# Patient Record
Sex: Female | Born: 2000 | Race: Black or African American | Hispanic: No | Marital: Single | Smoking: Never smoker
Health system: Southern US, Community
[De-identification: ages and names within clinical notes are randomized; demographics above are authoritative.]

## PROBLEM LIST (undated history)

## (undated) DIAGNOSIS — R6250 Unspecified lack of expected normal physiological development in childhood: Secondary | ICD-10-CM

## (undated) DIAGNOSIS — F319 Bipolar disorder, unspecified: Secondary | ICD-10-CM

## (undated) DIAGNOSIS — F419 Anxiety disorder, unspecified: Secondary | ICD-10-CM

## (undated) DIAGNOSIS — F913 Oppositional defiant disorder: Secondary | ICD-10-CM

## (undated) DIAGNOSIS — J45909 Unspecified asthma, uncomplicated: Secondary | ICD-10-CM

## (undated) DIAGNOSIS — F909 Attention-deficit hyperactivity disorder, unspecified type: Secondary | ICD-10-CM

## (undated) DIAGNOSIS — T7840XA Allergy, unspecified, initial encounter: Secondary | ICD-10-CM

## (undated) HISTORY — PX: NO PAST SURGERIES: SHX2092

## (undated) HISTORY — DX: Anxiety disorder, unspecified: F41.9

---
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
## 2022-01-04 ENCOUNTER — Ambulatory Visit (INDEPENDENT_AMBULATORY_CARE_PROVIDER_SITE_OTHER): Payer: Medicaid Other | Admitting: Advanced Practice Midwife

## 2022-01-04 ENCOUNTER — Encounter: Payer: Self-pay | Admitting: Advanced Practice Midwife

## 2022-01-04 VITALS — BP 120/78 | HR 101 | Wt 150.0 lb

## 2022-01-04 DIAGNOSIS — Z3403 Encounter for supervision of normal first pregnancy, third trimester: Secondary | ICD-10-CM

## 2022-01-04 DIAGNOSIS — Z3A34 34 weeks gestation of pregnancy: Secondary | ICD-10-CM

## 2022-01-04 NOTE — Progress Notes (Signed)
   LOW-RISK PREGNANCY VISIT Patient name: Alexandria [name] MRN [id number]  Date of birth: 11/12/2000 Chief Complaint:   Routine Prenatal Visit  History of Present Illness:   Alexandria [name] is a 21 y.o. G1P0 female at [redacted]w[redacted]d with an Estimated Date of Delivery: 02/12/22 being seen today for ongoing management of a low-risk pregnancy.  Today she reports no complaints. Contractions: Not present. Vag. Bleeding: None.  Movement: Present. denies leaking of fluid. Review of Systems:   Pertinent items are noted in HPI Denies abnormal vaginal discharge w/ itching/odor/irritation, headaches, visual changes, shortness of breath, chest pain, abdominal pain, severe nausea/vomiting, or problems with urination or bowel movements unless otherwise stated above. Pertinent History Reviewed:  Reviewed past medical,surgical, social, obstetrical and family history.  Reviewed problem list, medications and allergies. Physical Assessment:   Vitals:   01/04/22 1610  BP: 120/78  Pulse: (!) 101  Weight: 150 lb (68 kg)  Body mass index is 26.57 kg/m.        Physical Examination:   General appearance: Well appearing, and in no distress  Mental status: Alert, oriented to person, place, and time  Skin: Warm & dry  Cardiovascular: Normal heart rate noted  Respiratory: Normal respiratory effort, no distress  Abdomen: Soft, gravid, nontender  Pelvic: Cervical exam deferred         Extremities: Edema: None  Fetal Status: Fetal Heart Rate (bpm): 138 Fundal Height: 33 cm Movement: Present    Chaperone: n/a    No results found for this or any previous visit (from the past 24 hour(s)).  Assessment & Plan:  1) Low-risk pregnancy G1P0 at [redacted]w[redacted]d with an Estimated Date of Delivery: 02/12/22     Meds: No orders of the defined types were placed in this encounter.  Labs/procedures today:   Plan:  Continue routine obstetrical care  Next visit: prefers in person    Reviewed: Preterm labor symptoms and general  obstetric precautions including but not limited to vaginal bleeding, contractions, leaking of fluid and fetal movement were reviewed in detail with the patient.  All questions were answered. Has home bp cuff.. Check bp weekly, let us know if >140/90.   Follow-up: No follow-ups on file.  Future Appointments  Date Time Provider Department Center  01/18/2022  4:10 PM Arabella Merles, CNM [facility]  01/24/2022  3:00 PM [facility] - [facility] [place] [facility] None  01/25/2022  4:10 PM Arabella Merles, CNM [facility]  02/01/2022  4:10 PM Arabella Merles, CNM [facility]  02/08/2022  4:10 PM Arabella Merles, CNM [facility]    No orders of the defined types were placed in this encounter.  Jacklyn Shell DNP, CNM 01/04/2022 4:22 PM

## 2022-01-05 ENCOUNTER — Encounter: Payer: Medicaid Other | Admitting: Advanced Practice Midwife

## 2022-01-11 ENCOUNTER — Encounter: Payer: Medicaid Other | Admitting: Advanced Practice Midwife

## 2022-01-18 ENCOUNTER — Encounter: Payer: Medicaid Other | Admitting: Advanced Practice Midwife

## 2022-01-23 ENCOUNTER — Other Ambulatory Visit: Payer: Self-pay | Admitting: Women's Health

## 2022-01-23 ENCOUNTER — Ambulatory Visit (INDEPENDENT_AMBULATORY_CARE_PROVIDER_SITE_OTHER): Payer: Medicaid Other | Admitting: Women's Health

## 2022-01-23 ENCOUNTER — Encounter: Payer: Self-pay | Admitting: Women's Health

## 2022-01-23 ENCOUNTER — Other Ambulatory Visit (HOSPITAL_COMMUNITY)
Admission: RE | Admit: 2022-01-23 | Discharge: 2022-01-23 | Disposition: A | Discharge status: Home / Self Care | Payer: Medicaid Other | Source: Ambulatory Visit | Attending: Advanced Practice Midwife | Admitting: Advanced Practice Midwife

## 2022-01-23 VITALS — BP 96/60 | HR 77 | Wt 156.0 lb

## 2022-01-23 DIAGNOSIS — O26843 Uterine size-date discrepancy, third trimester: Secondary | ICD-10-CM

## 2022-01-23 DIAGNOSIS — Z3403 Encounter for supervision of normal first pregnancy, third trimester: Secondary | ICD-10-CM

## 2022-01-23 DIAGNOSIS — Z3A37 37 weeks gestation of pregnancy: Secondary | ICD-10-CM | POA: Insufficient documentation

## 2022-01-23 NOTE — Progress Notes (Signed)
LOW-RISK PREGNANCY VISIT Patient name: Alexandria [name] MRN [id number]  Date of birth: 03/11/2001 Chief Complaint:   Routine Prenatal Visit  History of Present Illness:   Alexandria [name] is a 21 y.o. G1P0 female at [redacted]w[redacted]d with an Estimated Date of Delivery: 02/12/22 being seen today for ongoing management of a low-risk pregnancy.   Today she reports no complaints. Contractions: Not present. Vag. Bleeding: None.  Movement: Present. denies leaking of fluid.     11/09/2021    9:53 AM 08/10/2021   10:53 AM  Depression screen PHQ 2/9  Decreased Interest 0 0  Down, Depressed, Hopeless 0 1  PHQ - 2 Score 0 1  Altered sleeping 0 0  Tired, decreased energy 1 1  Change in appetite 1 0  Feeling bad or failure about yourself  0 0  Trouble concentrating 0 0  Moving slowly or fidgety/restless 0 0  Suicidal thoughts 0 0  PHQ-9 Score 2 2        11/09/2021    9:53 AM 08/10/2021   10:53 AM  GAD 7 : Generalized Anxiety Score  Nervous, Anxious, on Edge 0 0  Control/stop worrying 1 0  Worry too much - different things 1 0  Trouble relaxing 0 0  Restless 0 0  Easily annoyed or irritable 1 0  Afraid - awful might happen 0 0  Total GAD 7 Score 3 0      Review of Systems:   Pertinent items are noted in HPI Denies abnormal vaginal discharge w/ itching/odor/irritation, headaches, visual changes, shortness of breath, chest pain, abdominal pain, severe nausea/vomiting, or problems with urination or bowel movements unless otherwise stated above. Pertinent History Reviewed:  Reviewed past medical,surgical, social, obstetrical and family history.  Reviewed problem list, medications and allergies. Physical Assessment:   Vitals:   01/23/22 1559  BP: 96/60  Pulse: 77  Weight: 156 lb (70.8 kg)  Body mass index is 27.63 kg/m.        Physical Examination:   General appearance: Well appearing, and in no distress  Mental status: Alert, oriented to person, place, and time  Skin: Warm &  dry  Cardiovascular: Normal heart rate noted  Respiratory: Normal respiratory effort, no distress  Abdomen: Soft, gravid, nontender  Pelvic:  cultures obtained, SVE deferred          Extremities: Edema: None  Fetal Status: Fetal Heart Rate (bpm): 140 Fundal Height: 30 cm Movement: Present Presentation: Vertex  Chaperone: Latisha Cresenzo   No results found for this or any previous visit (from the past 24 hour(s)).  Assessment & Plan:  1) Low-risk pregnancy G1P0 at [redacted]w[redacted]d with an Estimated Date of Delivery: 02/12/22   2) Size <dates, has EFW u/s tomorrow   Meds: No orders of the defined types were placed in this encounter.  Labs/procedures today: GBS and GC/CT  Plan:  Continue routine obstetrical care  Next visit: prefers in person    Reviewed: Term labor symptoms and general obstetric precautions including but not limited to vaginal bleeding, contractions, leaking of fluid and fetal movement were reviewed in detail with the patient.  All questions were answered. Does h ave home bp cuff. Office bp cuff given: not applicable. Check bp weekly, let us know if consistently >140 and/or >90.  Follow-up: Return for As scheduled tomorrow for u/s, cancel provider visit.  Future Appointments  Date Time Provider Department Center  01/24/2022  2:00 PM [facility] - [facility] [place] [facility] None  01/24/2022  2:50 PM  Roma Schanz, [place] [facility]  02/01/2022  4:10 PM Myrtis Ser, CNM [facility]  02/08/2022  4:10 PM Myrtis Ser, CNM [facility]    Orders Placed This Encounter  Procedures   Culture, beta strep (group b only)   Roma Schanz CNM, [facility] 01/23/2022 4:32 PM

## 2022-01-23 NOTE — Patient Instructions (Signed)
[place], thank you for choosing our office today! We appreciate the opportunity to meet your healthcare needs. You may receive a short survey by mail, e-mail, or through [employer]. If you are happy with your care we would appreciate if you could take just a few minutes to complete the survey questions. We read all of your comments and take your feedback very seriously. Thank you again for choosing our office.  Center for [employer] Team at [place] at [facility] ([place], [place] [address]) Entrance C, located off of [place] parking   CLASSES: Go to [email or web address] to register for classes (childbirth, breastfeeding, waterbirth, infant CPR, daddy bootcamp, etc.)  Call the office [phone]) or go to [facility] if: You begin to have strong, frequent contractions Your water breaks.  Sometimes it is a big gush of fluid, sometimes it is just a trickle that keeps getting your panties wet or running down your legs You have vaginal bleeding.  It is normal to have a small amount of spotting if your cervix was checked.  You don't feel your baby moving like normal.  If you don't, get you something to eat and drink and lay down and focus on feeling your baby move.   If your baby is still not moving like normal, you should call the office or go to [facility].  Call the office [phone]) or go to [facility] hospital for these signs of pre-eclampsia: Severe headache that does not go away with Tylenol Visual changes- seeing spots, double, blurred vision Pain under your right breast or upper abdomen that does not go away with Tums or heartburn medicine Nausea and/or vomiting Severe swelling in your hands, feet, and face   [facility] Pediatricians/Family Doctors [facility]): [address] [place], [place]: [address] [place], [place]): [place], [phone] (call to ask if accepting patients) [facility] Department: [address], [place], [place] [facility]): [address], [address], [place] [facility]: [address] [place], [place] [facility]: [place], [facility]): [phone] [facility]: [address] [place]: [address], [place] [facility]: [phone], [phone]  Home Blood Pressure Monitoring for Patients   Your provider has recommended that you check your blood pressure (BP) at least once a week at home. If you do not have a blood pressure cuff at home, one will be provided for you. Contact your provider if you have not received your monitor within 1 week.   Helpful Tips for Accurate Home Blood Pressure Checks  Don't smoke, exercise, or drink caffeine 30 minutes before checking your BP Use the restroom before checking your BP (a full bladder can raise your pressure) Relax in a comfortable upright chair Feet on the ground Left arm resting comfortably on a flat surface at the level of your heart Legs uncrossed Back supported Sit quietly and don't talk Place the cuff on your bare arm Adjust snuggly, so that only two fingertips  can fit between your skin and the top of the cuff Check 2 readings separated by at least one minute Keep a log of your BP readings For a visual, please reference this diagram: http://ccnc.care/bpdiagram  Provider Name: [facility]     Phone: [phone]  Zone 1: ALL CLEAR  Continue to monitor your symptoms:  BP reading is less than 140 (top number) or less than 90 (bottom number)  No right  upper stomach pain No headaches or seeing spots No feeling nauseated or throwing up No swelling in face and hands  Zone 2: CAUTION Call your doctor's office for any of the following:  BP reading is greater than 140 (top number) or greater than 90 (bottom number)  Stomach pain under your ribs in the middle or right side Headaches or seeing spots Feeling nauseated or throwing up Swelling in face and hands  Zone 3: EMERGENCY  Seek immediate medical care if you have any of the following:  BP reading is greater than160 (top number) or greater than 110 (bottom number) Severe headaches not improving with Tylenol Serious difficulty catching your breath Any worsening symptoms from Zone 2   Braxton Hicks Contractions Contractions of the uterus can occur throughout pregnancy, but they are not always a sign that you are in labor. You may have practice contractions called Braxton Hicks contractions. These false labor contractions are sometimes confused with true labor. What are [place] contractions? Braxton Hicks contractions are tightening movements that occur in the muscles of the uterus before labor. Unlike true labor contractions, these contractions do not result in opening (dilation) and thinning of the cervix. Toward the end of pregnancy (32-34 weeks), Braxton Hicks contractions can happen more often and may become stronger. These contractions are sometimes difficult to tell apart from true labor because they can be very uncomfortable. You should not feel embarrassed if you go to the hospital with false labor. Sometimes, the only way to tell if you are in true labor is for your health care provider to look for changes in the cervix. The health care provider will do a physical exam and may monitor your contractions. If you are not in true labor, the exam should show that your cervix is not dilating and your water has not broken. If there are no other health problems associated with your  pregnancy, it is completely safe for you to be sent home with false labor. You may continue to have Braxton Hicks contractions until you go into true labor. How to tell the difference between true labor and false labor True labor Contractions last 30-70 seconds. Contractions become very regular. Discomfort is usually felt in the top of the uterus, and it spreads to the lower abdomen and low back. Contractions do not go away with walking. Contractions usually become more intense and increase in frequency. The cervix dilates and gets thinner. False labor Contractions are usually shorter and not as strong as true labor contractions. Contractions are usually irregular. Contractions are often felt in the front of the lower abdomen and in the groin. Contractions may go away when you walk around or change positions while lying down. Contractions get weaker and are shorter-lasting as time goes on. The cervix usually does not dilate or become thin. Follow these instructions at home:  Take over-the-counter and prescription medicines only as told by your health care provider. Keep up with your usual exercises and follow other instructions from your health care provider. Eat and drink lightly if you think  you are going into labor. If Braxton Hicks contractions are making you uncomfortable: Change your position from lying down or resting to walking, or change from walking to resting. Sit and rest in a tub of warm water. Drink enough fluid to keep your urine pale yellow. Dehydration may cause these contractions. Do slow and deep breathing several times an hour. Keep all follow-up prenatal visits as told by your health care provider. This is important. Contact a health care provider if: You have a fever. You have continuous pain in your abdomen. Get help right away if: Your contractions become stronger, more regular, and closer together. You have fluid leaking or gushing from your vagina. You pass  blood-tinged mucus (bloody show). You have bleeding from your vagina. You have low back pain that you never had before. You feel your baby's head pushing down and causing pelvic pressure. Your baby is not moving inside you as much as it used to. Summary Contractions that occur before labor are called Braxton Hicks contractions, false labor, or practice contractions. Braxton Hicks contractions are usually shorter, weaker, farther apart, and less regular than true labor contractions. True labor contractions usually become progressively stronger and regular, and they become more frequent. Manage discomfort from [facility] contractions by changing position, resting in a warm bath, drinking plenty of water, or practicing deep breathing. This information is not intended to replace advice given to you by your health care provider. Make sure you discuss any questions you have with your health care provider. Document Revised: 03/30/2017 Document Reviewed: 08/31/2016 Elsevier Patient Education  [place].

## 2022-01-24 ENCOUNTER — Encounter: Payer: Medicaid Other | Admitting: Women's Health

## 2022-01-24 ENCOUNTER — Ambulatory Visit (INDEPENDENT_AMBULATORY_CARE_PROVIDER_SITE_OTHER): Payer: Medicaid Other

## 2022-01-24 DIAGNOSIS — Z3A37 37 weeks gestation of pregnancy: Secondary | ICD-10-CM

## 2022-01-24 DIAGNOSIS — O26843 Uterine size-date discrepancy, third trimester: Secondary | ICD-10-CM

## 2022-01-24 NOTE — Progress Notes (Signed)
[place] 99+8 wks,cephalic,anterior placenta gr 2,AFI 13.8 cm,FHR 150 bpm,EFW 2994 g 38%

## 2022-01-25 ENCOUNTER — Encounter: Payer: Medicaid Other | Admitting: Advanced Practice Midwife

## 2022-01-25 LAB — CERVICOVAGINAL ANCILLARY ONLY
Chlamydia: NEGATIVE
Comment: NEGATIVE
Comment: NORMAL
Neisseria Gonorrhea: NEGATIVE

## 2022-01-28 LAB — CULTURE, BETA STREP (GROUP B ONLY): Strep Gp B Culture: NEGATIVE

## 2022-02-01 ENCOUNTER — Encounter: Payer: Medicaid Other | Admitting: Advanced Practice Midwife

## 2022-02-02 ENCOUNTER — Encounter: Payer: Self-pay | Admitting: Advanced Practice Midwife

## 2022-02-02 ENCOUNTER — Ambulatory Visit (INDEPENDENT_AMBULATORY_CARE_PROVIDER_SITE_OTHER): Payer: Medicaid Other | Admitting: Advanced Practice Midwife

## 2022-02-02 VITALS — BP 121/83 | HR 80 | Wt 153.0 lb

## 2022-02-02 DIAGNOSIS — Z3403 Encounter for supervision of normal first pregnancy, third trimester: Secondary | ICD-10-CM

## 2022-02-02 DIAGNOSIS — Z3A38 38 weeks gestation of pregnancy: Secondary | ICD-10-CM

## 2022-02-02 NOTE — Patient Instructions (Signed)

## 2022-02-02 NOTE — Progress Notes (Signed)
   LOW-RISK PREGNANCY VISIT Patient name: Alexandria [name] MRN [id number]  Date of birth: 2001-02-18 Chief Complaint:   Routine Prenatal Visit  History of Present Illness:   Alexandria [name] is a 21 y.o. G1P0 female at [redacted]w[redacted]d with an Estimated Date of Delivery: 02/11/22 being seen today for ongoing management of a low-risk pregnancy.  Today she reports no complaints. Contractions: Irritability. Vag. Bleeding: None.  Movement: Present. denies leaking of fluid. Review of Systems:   Pertinent items are noted in HPI Denies abnormal vaginal discharge w/ itching/odor/irritation, headaches, visual changes, shortness of breath, chest pain, abdominal pain, severe nausea/vomiting, or problems with urination or bowel movements unless otherwise stated above. Pertinent History Reviewed:  Reviewed past medical,surgical, social, obstetrical and family history.  Reviewed problem list, medications and allergies. Physical Assessment:   Vitals:   02/02/22 1411  BP: 121/83  Pulse: 80  Weight: 153 lb (69.4 kg)  Body mass index is 27.1 kg/m.        Physical Examination:   General appearance: Well appearing, and in no distress  Mental status: Alert, oriented to person, place, and time  Skin: Warm & dry  Cardiovascular: Normal heart rate noted  Respiratory: Normal respiratory effort, no distress  Abdomen: Soft, gravid, nontender  Pelvic: Cervical exam performed       Called off exam midway d/t discomfort  Extremities: Edema: None  Fetal Status:     Movement: Present    Chaperone:  callandra, RN     No results found for this or any previous visit (from the past 24 hour(s)).  Assessment & Plan:  1) Low-risk pregnancy G1P0 at [redacted]w[redacted]d with an Estimated Date of Delivery: 02/11/22   2) size <dates, EFW 38% at 37 weeks   Meds: No orders of the defined types were placed in this encounter.  Labs/procedures today: none  Plan:  Continue routine obstetrical care  Next visit: prefers in person     Reviewed: Term labor symptoms and general obstetric precautions including but not limited to vaginal bleeding, contractions, leaking of fluid and fetal movement were reviewed in detail with the patient.  All questions were answered. Has home bp cuff. . Check bp weekly, let us know if >140/90.   Follow-up: No follow-ups on file.  Future Appointments  Date Time Provider [place]  02/08/2022  4:10 PM Myrtis Ser, CNM [facility]    No orders of the defined types were placed in this encounter.  Christin Fudge DNP, CNM 02/02/2022 2:28 PM

## 2022-02-05 ENCOUNTER — Other Ambulatory Visit: Payer: Self-pay

## 2022-02-05 ENCOUNTER — Inpatient Hospital Stay (HOSPITAL_COMMUNITY)
Admission: AD | Admit: 2022-02-05 | Discharge: 2022-02-05 | Disposition: A | Discharge status: Home / Self Care | Payer: Medicaid Other | Attending: Obstetrics and Gynecology | Admitting: Obstetrics and Gynecology

## 2022-02-05 ENCOUNTER — Encounter (HOSPITAL_COMMUNITY): Payer: Self-pay | Admitting: Obstetrics and Gynecology

## 2022-02-05 DIAGNOSIS — Z3A39 39 weeks gestation of pregnancy: Secondary | ICD-10-CM | POA: Diagnosis not present

## 2022-02-05 DIAGNOSIS — O471 False labor at or after 37 completed weeks of gestation: Secondary | ICD-10-CM | POA: Diagnosis present

## 2022-02-05 DIAGNOSIS — K5900 Constipation, unspecified: Secondary | ICD-10-CM | POA: Diagnosis not present

## 2022-02-05 DIAGNOSIS — O479 False labor, unspecified: Secondary | ICD-10-CM

## 2022-02-05 DIAGNOSIS — O99613 Diseases of the digestive system complicating pregnancy, third trimester: Secondary | ICD-10-CM | POA: Insufficient documentation

## 2022-02-05 LAB — FERNING: Lab: NEGATIVE

## 2022-02-05 MED ORDER — POLYETHYLENE GLYCOL 3350 17 GM/SCOOP PO POWD: 17.0000 g | Freq: Every day | ORAL | 0 refills | Status: DC

## 2022-02-05 NOTE — MAU Note (Signed)
Pt reports to mau with c/o lower abd pain that comes and goes.  Unsure if ctx.  Reports some LOF.  PT mother states EMS was called last night for pain but she refused to go.  Pt reports constipation for the past 3-4 days.  +FM

## 2022-02-05 NOTE — MAU Provider Note (Signed)
Chief Complaint:  Contractions, Constipation, and Rupture of Membranes   None     HPI: Alexandria [name] is a 21 y.o. G1P0 at [redacted]w[redacted]d by early ultrasound who presents to maternity admissions reporting back pain, abdominal pain, and leaking clear fluid today.  She reports the pain started last night and is unchanged since onset. She leaked clear fluid and wet her underwear but the amount would not have required a pad. She has not had a bowel movement in 3 days.  There are no other symptoms. She reports good fetal movement.   HPI  Past Medical History: Past Medical History:  Diagnosis Date   ADHD (attention deficit hyperactivity disorder)    Allergy    Anxiety    Asthma    Bipolar 1 disorder (HCC)    Development delay    Oppositional defiant disorder     Past obstetric history: OB History  Gravida Para Term Preterm AB Living  1            SAB IAB Ectopic Multiple Live Births               # Outcome Date GA Lbr Len/2nd Weight Sex Delivery Anes PTL Lv  1 Current             Past Surgical History: History reviewed. No pertinent surgical history.  Family History: Family History  Problem Relation Age of Onset   ADD / ADHD Mother    Bipolar disorder Mother    Anxiety disorder Mother     Social History: Social History   Tobacco Use   Smoking status: Never   Smokeless tobacco: Never  Vaping Use   Vaping Use: Never used  Substance Use Topics   Alcohol use: No   Drug use: Not Currently    Types: Marijuana    Allergies:  Allergies  Allergen Reactions   Lactose Intolerance (Gi) Nausea And Vomiting    Meds:  No medications prior to admission.    ROS:  Review of Systems  Constitutional:  Negative for chills, fatigue and fever.  Eyes:  Negative for visual disturbance.  Respiratory:  Negative for shortness of breath.   Cardiovascular:  Negative for chest pain.  Gastrointestinal:  Negative for abdominal pain, nausea and vomiting.  Genitourinary:  Negative for  difficulty urinating, dysuria, flank pain, pelvic pain, vaginal bleeding, vaginal discharge and vaginal pain.  Neurological:  Negative for dizziness and headaches.  Psychiatric/Behavioral: Negative.       I have reviewed patient's Past Medical Hx, Surgical Hx, Family Hx, Social Hx, medications and allergies.   Physical Exam  Patient Vitals for the past 24 hrs:  BP Temp Temp src Pulse Resp SpO2  02/05/22 1434 126/86 -- -- 96 16 100 %  02/05/22 1316 114/77 98.1 F (36.7 C) Oral 99 16 100 %   Constitutional: Well-developed, well-nourished female in no acute distress.  Cardiovascular: normal rate Respiratory: normal effort GI: Abd soft, non-tender, gravid appropriate for gestational age.  MS: Extremities nontender, no edema, normal ROM Neurologic: Alert and oriented x 4.  GU: Neg CVAT.  PELVIC EXAM:   Dilation: Closed Effacement (%): Thick Station:  (high) Exam by:: L. Leftwich-Kirby,CNM  FHT:  Baseline 135 , moderate variability, accelerations present, no decelerations Contractions: rare, mild to palpation   Labs: No results found for this or any previous visit (from the past 24 hour(s)). A/Negative/-- (04/10 1020)  Imaging:  US OB Follow Up  Result Date: 01/27/2022 Table formatting from the original result was  not included. Images from the original result were not included.  ..an Sales executive of Ultrasound Medicine Diplomatic Services operational officer) accredited practice Center for [facility] @ [place] [address] Ordering Provider: Roma Schanz, CNM FOLLOW UP SONOGRAM Alexandria [name] is in the office for a follow up sonogram for EFW. She is a 21 y.o. year old G1P0 with Estimated Date of Delivery: 02/11/22 by early ultrasound now at  [redacted]w[redacted]d weeks gestation. Thus far the pregnancy has been complicated by small for dates . GESTATION: SINGLETON PRESENTATION: cephalic FETAL ACTIVITY:          Heart rate         150          The fetus is active. AMNIOTIC  FLUID: The amniotic fluid volume is  normal, 13.8 cm. PLACENTA LOCALIZATION:  anterior GRADE 2 CERVIX: Limited view GESTATIONAL AGE AND  BIOMETRICS: Gestational criteria: Estimated Date of Delivery: 02/11/22 by early ultrasound now at [redacted]w[redacted]d Previous Scans:3          BIPARIETAL DIAMETER           9.11 cm         37 weeks HEAD CIRCUMFERENCE           33.47 cm         38+2 weeks ABDOMINAL CIRCUMFERENCE           32.69 cm         36+4 weeks FEMUR LENGTH           7 cm         35+6 weeks                                                       AVERAGE EGA(BY THIS SCAN):  37 weeks                                                 ESTIMATED FETAL WEIGHT:       2994  grams, 38 % ANATOMICAL SURVEY                                                                            COMMENTS CEREBRAL VENTRICLES yes normal  CHOROID PLEXUS yes normal  CEREBELLUM yes normal  CISTERNA MAGNA  Yes  normal   CAVUM SEPTI PELLUCIDI YES NORMAL              NOSE/LIP yes normal  FACIAL PROFILE yes normal  4 CHAMBERED HEART yes normal  OUTFLOW TRACTS YES normaL  3VV YES NORMAL  3VTV YES NORMAL  SITUS YES NORMAL      DIAPHRAGM yes normal  STOMACH yes normal  RENAL REGION yes normal  BLADDER yes normal          3 VESSEL CORD yes normal              GENITALIA yes normal  female     SUSPECTED ABNORMALITIES:  no QUALITY OF SCAN: satisfactory TECHNICIAN COMMENTS: [place] 09+7 wks,cephalic,anterior placenta gr 2,AFI 13.8 cm,FHR 150 bpm,EFW 2994 g 38% A copy of this report including all images has been saved and backed up to a second source for retrieval if needed. All measures and details of the anatomical scan, placentation, fluid volume and pelvic anatomy are contained in that report. Amber Heide Guile 01/24/2022 2:32 PM Clinical Impression and recommendations: I have reviewed the sonogram results above, combined with the patient's current clinical course, below are my impressions and any appropriate recommendations for management based on the sonographic findings. 1.   G1P0 Estimated Date of Delivery: 02/11/22 by serial sonographic evaluations 2.  Fetal sonographic surveillance findings: a). Normal fluid volume b). Normal growth percentile with appropriate interval growth:  38% 3.  Normal general sonographic findings Recommend continued prenatal evaluations and care based on this sonogram and as clinically indicated from the patient's clinical course. Florian Buff 01/27/2022 10:05 PM     MAU Course/MDM: Orders Placed This Encounter  Procedures   Discharge patient    Meds ordered this encounter  Medications   polyethylene glycol powder (GLYCOLAX/MIRALAX) 17 GM/SCOOP powder    Sig: Take 17 g by mouth daily.    Dispense:  255 g    Refill:  0    Order Specific Question:   Supervising Provider    Answer:   Aletha Halim [[id number]]     NST reviewed Cervix closed, no evidence of ROM Rest/ice/heat/warm bath/increase PO fluids/Tylenol/pregnancy support belt  Miralax for constipation, reviewed dietary changes. F/U as scheduled in office Return as needed for signs of labor or emergencies   Assessment: 1. [redacted] weeks gestation of pregnancy   2. False labor   3. Constipation during pregnancy in third trimester     Plan: Discharge home Labor precautions and fetal kick counts  Follow-up Information     [facility] up.   Why: As scheduled Contact information: [place] [phone]               Allergies as of 02/05/2022       Reactions   Lactose Intolerance (gi) Nausea And Vomiting        Medication List     TAKE these medications    acetaminophen 325 MG tablet Commonly known as: TYLENOL Take 325 mg by mouth every 6 (six) hours as needed for moderate pain.   albuterol 108 (90 Base) MCG/ACT inhaler Commonly known as: VENTOLIN HFA Inhale 2 puffs into the lungs every 6 (six) hours as needed for wheezing or shortness of breath.   aspirin EC 81 MG tablet Take 1 tablet (81  mg total) by mouth daily. Swallow whole.   Blood Pressure Monitor Misc For regular home bp monitoring during pregnancy   cetirizine 10 MG tablet Commonly known as: ZYRTEC Take 10 mg by mouth daily.   ferrous sulfate 325 (65 FE) MG tablet Take 1 tablet (325 mg total) by mouth every other day.   ipratropium 0.06 % nasal spray Commonly known as: Atrovent Place 1 spray into both nostrils 4 (four) times daily.   ondansetron 4 MG disintegrating tablet Commonly known as: Zofran ODT Take 1 tablet (4 mg total) by mouth every 8 (eight) hours as needed for nausea or vomiting.   polyethylene glycol powder 17 GM/SCOOP powder Commonly known as: GLYCOLAX/MIRALAX Take 17 g by mouth daily.   PRENATAL VITAMIN PO Take by  mouth.        Fatima Blank Certified Nurse-Midwife 02/05/2022 2:38 PM

## 2022-02-05 NOTE — Discharge Instructions (Signed)
Reasons to return to MAU at [facility]:  1.  Contractions are  5 minutes apart or less, each last 1 minute, these have been going on for 1-2 hours, and you cannot walk or talk during them 2.  You have a large gush of fluid, or a trickle of fluid that will not stop and you have to wear a pad 3.  You have bleeding that is bright red, heavier than spotting--like menstrual bleeding (spotting can be normal in early labor or after a check of your cervix) 4.  You do not feel the baby moving like he/she normally does  

## 2022-02-08 ENCOUNTER — Inpatient Hospital Stay (HOSPITAL_COMMUNITY)
Admission: AD | Admit: 2022-02-08 | Discharge: 2022-02-08 | Disposition: A | Discharge status: Home / Self Care | Payer: Medicaid Other | Attending: Obstetrics and Gynecology | Admitting: Obstetrics and Gynecology

## 2022-02-08 ENCOUNTER — Ambulatory Visit (INDEPENDENT_AMBULATORY_CARE_PROVIDER_SITE_OTHER): Payer: Medicaid Other | Admitting: Advanced Practice Midwife

## 2022-02-08 VITALS — BP 99/57 | HR 90 | Wt 159.0 lb

## 2022-02-08 DIAGNOSIS — O99013 Anemia complicating pregnancy, third trimester: Secondary | ICD-10-CM

## 2022-02-08 DIAGNOSIS — Z3A39 39 weeks gestation of pregnancy: Secondary | ICD-10-CM

## 2022-02-08 DIAGNOSIS — O479 False labor, unspecified: Secondary | ICD-10-CM

## 2022-02-08 MED ORDER — ONDANSETRON 4 MG PO TBDP
4.0000 mg | ORAL_TABLET | Freq: Once | ORAL | Status: AC
  Administered 2022-02-08: 4 mg via ORAL
  Filled 2022-02-08: qty 1

## 2022-02-08 NOTE — MAU Note (Signed)
RN educated patient about discharge instructions and labor precautions. Patient verbalized understanding.

## 2022-02-08 NOTE — Progress Notes (Signed)
   LOW-RISK PREGNANCY VISIT Patient name: Alexandria [name] MRN [id number]  Date of birth: 07/31/2000 Chief Complaint:   Routine Prenatal Visit  History of Present Illness:   Alexandria [name] is a 21 y.o. G1P0 female at [redacted]w[redacted]d with an Estimated Date of Delivery: 02/11/22 being seen today for ongoing management of a low-risk pregnancy.  Today she reports  having MAU labor eval overnight- cx closed- ctx have subsided . Contractions: Irritability. Vag. Bleeding: None.  Movement: Present. denies leaking of fluid. Review of Systems:   Pertinent items are noted in HPI Denies abnormal vaginal discharge w/ itching/odor/irritation, headaches, visual changes, shortness of breath, chest pain, abdominal pain, severe nausea/vomiting, or problems with urination or bowel movements unless otherwise stated above. Pertinent History Reviewed:  Reviewed past medical,surgical, social, obstetrical and family history.  Reviewed problem list, medications and allergies. Physical Assessment:   Vitals:   02/08/22 1608  BP: (!) 99/57  Pulse: 90  Weight: 159 lb (72.1 kg)  Body mass index is 28.17 kg/m.        Physical Examination:   General appearance: Well appearing, and in no distress  Mental status: Alert, oriented to person, place, and time  Skin: Warm & dry  Cardiovascular: Normal heart rate noted  Respiratory: Normal respiratory effort, no distress  Abdomen: Soft, gravid, nontender  Pelvic: Cervical exam deferred         Extremities: Edema: None  Fetal Status: Fetal Heart Rate (bpm): 139 Fundal Height: 35 cm Movement: Present    No results found for this or any previous visit (from the past 24 hour(s)).  Assessment & Plan:  1) Low-risk pregnancy G1P0 at [redacted]w[redacted]d with an Estimated Date of Delivery: 02/11/22   2) Reviewed postdates plans, will get NST at 40.2wks if still pregnant, and then sched IOL for 41.0wks   Meds: No orders of the defined types were placed in this encounter.  Labs/procedures  today: none  Plan:  Continue routine obstetrical care   Reviewed: Term labor symptoms and general obstetric precautions including but not limited to vaginal bleeding, contractions, leaking of fluid and fetal movement were reviewed in detail with the patient.  All questions were answered. Didn't ask about home bp cuff. Check bp weekly, let us know if >140/90.   Follow-up: Return in about 5 days (around 02/13/2022) for LROB w NST.  No orders of the defined types were placed in this encounter.  Myrtis Ser CNM 02/08/2022 4:29 PM

## 2022-02-08 NOTE — MAU Provider Note (Signed)
Ms. Alexandria [name] is a G1P0 at [redacted]w[redacted]d seen in MAU for labor. RN labor check, not seen by provider. SVE by RN Dilation: Closed Effacement (%): Thick Presentation: Vertex Exam by:: Youlanda Roys, RN   NST - FHR: 130 bpm / moderate variability / accels present / decels absent / TOCO: regular every 3-5 mins   Plan:  D/C home with labor precautions Keep scheduled appt with Derrill Memo on today at 4:10 PM.   Concepcion Living, MD  02/08/2022 2:28 AM

## 2022-02-08 NOTE — MAU Note (Signed)
.  Alexandria [name] is a 21 y.o. at [redacted]w[redacted]d here in MAU reporting: arrived via EMS to MAU for ctx 0100 about 7 mins apart, but when EMS arrived they stopped and having not continued. Pt report some of mucus plug lost. Pt also reports ongoing N/V with 1 episode of vomiting yesterday am. Pt denies VB, LOF, DFM, and complications in the pregnancy.  SVE last 0/Closed  Onset of complaint: 0100 Pain score: 0/10 Vitals:   02/08/22 0205  BP: 131/82  Pulse: 93  Resp: 16  Temp: 98.5 F (36.9 C)  SpO2: 100%     FHT:140 Lab orders placed from triage:

## 2022-02-11 ENCOUNTER — Inpatient Hospital Stay (HOSPITAL_COMMUNITY)
Admission: AD | Admit: 2022-02-11 | Discharge: 2022-02-11 | Disposition: A | Discharge status: Home / Self Care | Payer: Medicaid Other | Attending: Family Medicine | Admitting: Family Medicine

## 2022-02-11 ENCOUNTER — Encounter (HOSPITAL_COMMUNITY): Payer: Self-pay | Admitting: Family Medicine

## 2022-02-11 DIAGNOSIS — O471 False labor at or after 37 completed weeks of gestation: Secondary | ICD-10-CM | POA: Diagnosis not present

## 2022-02-11 DIAGNOSIS — O26899 Other specified pregnancy related conditions, unspecified trimester: Secondary | ICD-10-CM

## 2022-02-11 DIAGNOSIS — O479 False labor, unspecified: Secondary | ICD-10-CM

## 2022-02-11 DIAGNOSIS — Z3403 Encounter for supervision of normal first pregnancy, third trimester: Secondary | ICD-10-CM

## 2022-02-11 DIAGNOSIS — Z3A40 40 weeks gestation of pregnancy: Secondary | ICD-10-CM | POA: Insufficient documentation

## 2022-02-11 DIAGNOSIS — O99013 Anemia complicating pregnancy, third trimester: Secondary | ICD-10-CM

## 2022-02-11 NOTE — MAU Provider Note (Signed)
S: Ms. Alexandria [name] is a 21 y.o. G1P0 at [redacted]w[redacted]d  who presents to MAU today complaining contractions q31min since 3am. She denies vaginal bleeding. She denies LOF. She reports normal fetal movement.    O: BP 127/84 (BP Location: Right Arm)   Pulse (!) 102   Temp 98.1 F (36.7 C) (Oral)   Resp 20   Ht 5\' 3"  (1.6 m)   Wt 72.6 kg   LMP  (LMP Unknown)   BMI 28.36 kg/m  GENERAL: Well-developed, well-nourished female in no acute distress.  HEAD: Normocephalic, atraumatic.  CHEST: Normal effort of breathing, regular heart rate ABDOMEN: Soft, nontender, gravid  Cervical exam:  Dilation: Closed Effacement (%): Thick Exam by:: Ardelle Lesches, Rn.   Fetal Monitoring: Baseline: 135 Variability: moderate Accelerations: 15x15 Decelerations: none Contractions: occasional   A: SIUP at [redacted]w[redacted]d  False labor  P: Return and Labor precautions See primary OB.   Shelda Pal, DO 02/11/2022 6:25 AM

## 2022-02-11 NOTE — MAU Note (Signed)
Pt says [facility]  No VE UC strong since 3-4 am  Feels constipated - none this week

## 2022-02-13 ENCOUNTER — Other Ambulatory Visit (INDEPENDENT_AMBULATORY_CARE_PROVIDER_SITE_OTHER): Payer: Medicaid Other

## 2022-02-13 ENCOUNTER — Other Ambulatory Visit: Payer: Self-pay | Admitting: Advanced Practice Midwife

## 2022-02-13 ENCOUNTER — Ambulatory Visit (INDEPENDENT_AMBULATORY_CARE_PROVIDER_SITE_OTHER): Payer: Medicaid Other | Admitting: *Deleted

## 2022-02-13 DIAGNOSIS — Z3A40 40 weeks gestation of pregnancy: Secondary | ICD-10-CM

## 2022-02-13 DIAGNOSIS — O4290 Premature rupture of membranes, unspecified as to length of time between rupture and onset of labor, unspecified weeks of gestation: Secondary | ICD-10-CM

## 2022-02-13 DIAGNOSIS — Z3403 Encounter for supervision of normal first pregnancy, third trimester: Secondary | ICD-10-CM

## 2022-02-13 DIAGNOSIS — O480 Post-term pregnancy: Secondary | ICD-10-CM

## 2022-02-13 NOTE — Progress Notes (Signed)
[place] 45+6 wks,cephalic,anterior placenta gr 3,AFI 13 cm,FHR 146 bpm

## 2022-02-13 NOTE — Progress Notes (Addendum)
   NURSE VISIT- NST  SUBJECTIVE:  Alexandria [name] is a 21 y.o. G1P0 female at [redacted]w[redacted]d, here for a NST for pregnancy complicated by Post dates.  She reports active fetal movement, contractions: irregular, vaginal bleeding: none, membranes: intact.   OBJECTIVE:  BP 120/78   Pulse 84   Wt 157 lb (71.2 kg)   LMP  (LMP Unknown)   BMI 27.81 kg/m   Appears well, no apparent distress  No results found for this or any previous visit (from the past 24 hour(s)).  NST: FHR baseline 140 bpm, Variability: moderate, Accelerations:present, Decelerations:  Absent= Cat 1/reactive Toco: occasional   ASSESSMENT: G1P0 at [redacted]w[redacted]d with Post dates NST reactive  PLAN: EFM strip reviewed by Nigel Berthold, CNM   Recommendations: keep next appointment as scheduled    [name]  02/13/2022 1:41 PM  Chart reviewed for nurse visit. Agree with plan of care.  Christin Fudge, [place] 02/13/2022 2:05 PM

## 2022-02-14 ENCOUNTER — Encounter (HOSPITAL_COMMUNITY): Payer: Self-pay | Admitting: Obstetrics and Gynecology

## 2022-02-14 ENCOUNTER — Inpatient Hospital Stay (HOSPITAL_COMMUNITY)
Admission: AD | Admit: 2022-02-14 | Discharge: 2022-02-14 | Disposition: A | Discharge status: Home / Self Care | Payer: Medicaid Other | Attending: Obstetrics and Gynecology | Admitting: Obstetrics and Gynecology

## 2022-02-14 DIAGNOSIS — Z3A40 40 weeks gestation of pregnancy: Secondary | ICD-10-CM | POA: Insufficient documentation

## 2022-02-14 DIAGNOSIS — O471 False labor at or after 37 completed weeks of gestation: Secondary | ICD-10-CM | POA: Diagnosis present

## 2022-02-14 DIAGNOSIS — O480 Post-term pregnancy: Secondary | ICD-10-CM | POA: Insufficient documentation

## 2022-02-14 DIAGNOSIS — O479 False labor, unspecified: Secondary | ICD-10-CM

## 2022-02-14 NOTE — MAU Note (Signed)
Alexandria [name] is a 21 y.o. at [redacted]w[redacted]d here in MAU reporting: by EMS for a pt request of a check up. The pt did not report any pain or state any concerns. Pt states she had one spot of brown discharge yesterday. Pt denies vaginal discharge and LOF. +FM.  Onset of complaint: 02/14/2022 Pain score: Pt denies pain  Vitals:   02/14/22 0143 02/14/22 0152  BP: 119/89 120/80  Pulse: 87 87  Resp: 16   Temp: 98 F (36.7 C)   SpO2: 95%      FHT:134 Lab orders placed from triage:  none

## 2022-02-16 ENCOUNTER — Ambulatory Visit (INDEPENDENT_AMBULATORY_CARE_PROVIDER_SITE_OTHER): Payer: Medicaid Other | Admitting: Advanced Practice Midwife

## 2022-02-16 ENCOUNTER — Encounter: Payer: Self-pay | Admitting: Advanced Practice Midwife

## 2022-02-16 VITALS — BP 124/80 | HR 84 | Wt 157.0 lb

## 2022-02-16 DIAGNOSIS — O480 Post-term pregnancy: Secondary | ICD-10-CM | POA: Diagnosis not present

## 2022-02-16 DIAGNOSIS — O26899 Other specified pregnancy related conditions, unspecified trimester: Secondary | ICD-10-CM

## 2022-02-16 DIAGNOSIS — Z3403 Encounter for supervision of normal first pregnancy, third trimester: Secondary | ICD-10-CM | POA: Diagnosis not present

## 2022-02-16 DIAGNOSIS — Z6791 Unspecified blood type, Rh negative: Secondary | ICD-10-CM

## 2022-02-16 DIAGNOSIS — O99013 Anemia complicating pregnancy, third trimester: Secondary | ICD-10-CM

## 2022-02-16 NOTE — Patient Instructions (Signed)

## 2022-02-16 NOTE — Progress Notes (Signed)
   LOW-RISK PREGNANCY VISIT Patient name: Alexandria [name] MRN [id number]  Date of birth: [birth year] Chief Complaint:   No chief complaint on file.  History of Present Illness:   Alexandria [name] is a 21 y.o. G1P0 female at [redacted]w[redacted]d with an Estimated Date of Delivery: 02/11/22 being seen today for ongoing management of a low-risk pregnancy.  Today she reports no complaints. Contractions: Irritability. Vag. Bleeding: None.  Movement: Present. denies leaking of fluid. Review of Systems:   Pertinent items are noted in HPI Denies abnormal vaginal discharge w/ itching/odor/irritation, headaches, visual changes, shortness of breath, chest pain, abdominal pain, severe nausea/vomiting, or problems with urination or bowel movements unless otherwise stated above. Pertinent History Reviewed:  Reviewed past medical,surgical, social, obstetrical and family history.  Reviewed problem list, medications and allergies. Physical Assessment:   Vitals:   02/16/22 1035  BP: 124/80  Pulse: 84  Weight: 157 lb (71.2 kg)  Body mass index is 27.81 kg/m.        Physical Examination:   General appearance: Well appearing, and in no distress  Mental status: Alert, oriented to person, place, and time  Skin: Warm & dry  Cardiovascular: Normal heart rate noted  Respiratory: Normal respiratory effort, no distress  Abdomen: Soft, gravid, nontender  Pelvic: Cervical exam deferred         Extremities: Edema: None  Fetal Status:     Movement: Present  FHR baseline 150 bpm, Variability: absent, Accelerations:present, Decelerations:  Absent= Cat 1/Reactive  Chaperone: n/a    No results found for this or any previous visit (from the past 24 hour(s)).  Assessment & Plan:  1) Low-risk pregnancy G1P0 at [redacted]w[redacted]d with an Estimated Date of Delivery: 02/11/22   2) postdates, IOL scheduled, orders in   Meds: No orders of the defined types were placed in this encounter.  Labs/procedures today: NST:   Plan:  Continue  routine obstetrical care IOL form sent in; schedule b/t 10/21 and 10/24 for postdates: Term labor symptoms and general obstetric precautions including but not limited to vaginal bleeding, contractions, leaking of fluid and fetal movement were reviewed in detail with the patient.  All questions were answered. Has home bp cuff. Check bp weekly, let us know if >140/90.   Follow-up: No follow-ups on file.  Future Appointments  Date Time Provider [place]  02/20/2022 10:50 AM [facility] NURSE [facility]    No orders of the defined types were placed in this encounter.  Christin Fudge DNP, CNM 02/16/2022 11:13 AM

## 2022-02-18 ENCOUNTER — Inpatient Hospital Stay (HOSPITAL_COMMUNITY): Payer: Medicaid Other

## 2022-02-19 ENCOUNTER — Other Ambulatory Visit: Payer: Self-pay

## 2022-02-19 ENCOUNTER — Encounter (HOSPITAL_COMMUNITY): Payer: Self-pay | Admitting: Family Medicine

## 2022-02-19 ENCOUNTER — Inpatient Hospital Stay (HOSPITAL_COMMUNITY)
Admission: RE | Admit: 2022-02-19 | Discharge: 2022-02-22 | DRG: 806 | Disposition: A | Discharge status: Home / Self Care | Payer: Medicaid Other | Attending: Family Medicine | Admitting: Family Medicine

## 2022-02-19 DIAGNOSIS — O26893 Other specified pregnancy related conditions, third trimester: Secondary | ICD-10-CM | POA: Diagnosis present

## 2022-02-19 DIAGNOSIS — O99344 Other mental disorders complicating childbirth: Secondary | ICD-10-CM | POA: Diagnosis present

## 2022-02-19 DIAGNOSIS — Z3400 Encounter for supervision of normal first pregnancy, unspecified trimester: Secondary | ICD-10-CM

## 2022-02-19 DIAGNOSIS — O134 Gestational [pregnancy-induced] hypertension without significant proteinuria, complicating childbirth: Secondary | ICD-10-CM | POA: Diagnosis present

## 2022-02-19 DIAGNOSIS — O26899 Other specified pregnancy related conditions, unspecified trimester: Secondary | ICD-10-CM

## 2022-02-19 DIAGNOSIS — F902 Attention-deficit hyperactivity disorder, combined type: Secondary | ICD-10-CM | POA: Diagnosis present

## 2022-02-19 DIAGNOSIS — Z30017 Encounter for initial prescription of implantable subdermal contraceptive: Secondary | ICD-10-CM

## 2022-02-19 DIAGNOSIS — Z6791 Unspecified blood type, Rh negative: Secondary | ICD-10-CM | POA: Diagnosis not present

## 2022-02-19 DIAGNOSIS — O9902 Anemia complicating childbirth: Secondary | ICD-10-CM | POA: Diagnosis present

## 2022-02-19 DIAGNOSIS — O480 Post-term pregnancy: Secondary | ICD-10-CM | POA: Diagnosis present

## 2022-02-19 DIAGNOSIS — Z7982 Long term (current) use of aspirin: Secondary | ICD-10-CM

## 2022-02-19 DIAGNOSIS — Z3A41 41 weeks gestation of pregnancy: Secondary | ICD-10-CM

## 2022-02-19 DIAGNOSIS — Z975 Presence of (intrauterine) contraceptive device: Secondary | ICD-10-CM

## 2022-02-19 DIAGNOSIS — O99019 Anemia complicating pregnancy, unspecified trimester: Secondary | ICD-10-CM | POA: Diagnosis present

## 2022-02-19 DIAGNOSIS — F70 Mild intellectual disabilities: Secondary | ICD-10-CM | POA: Diagnosis present

## 2022-02-19 LAB — CBC
HCT: 31.4 % — ABNORMAL LOW (ref 36.0–46.0)
Hemoglobin: 10.8 g/dL — ABNORMAL LOW (ref 12.0–15.0)
MCH: 31.9 pg (ref 26.0–34.0)
MCHC: 34.4 g/dL (ref 30.0–36.0)
MCV: 92.6 fL (ref 80.0–100.0)
Platelets: 233 10*3/uL (ref 150–400)
RBC: 3.39 MIL/uL — ABNORMAL LOW (ref 3.87–5.11)
RDW: 15.9 % — ABNORMAL HIGH (ref 11.5–15.5)
WBC: 6.6 10*3/uL (ref 4.0–10.5)
nRBC: 0 % (ref 0.0–0.2)

## 2022-02-19 LAB — TYPE AND SCREEN
ABO/RH(D): A NEG
Antibody Screen: POSITIVE

## 2022-02-19 MED ORDER — MISOPROSTOL 50MCG HALF TABLET
50.0000 ug | ORAL_TABLET | Freq: Once | ORAL | Status: AC
  Administered 2022-02-19: 50 ug via ORAL
  Filled 2022-02-19: qty 1

## 2022-02-19 MED ORDER — ACETAMINOPHEN 325 MG PO TABS
650.0000 mg | ORAL_TABLET | ORAL | Status: DC | PRN
  Administered 2022-02-20: 650 mg via ORAL
  Filled 2022-02-19: qty 2

## 2022-02-19 MED ORDER — OXYTOCIN-SODIUM CHLORIDE 30-0.9 UT/500ML-% IV SOLN
2.5000 [IU]/h | INTRAVENOUS | Status: DC
  Filled 2022-02-19: qty 500

## 2022-02-19 MED ORDER — HYDROXYZINE HCL 50 MG PO TABS: 50.0000 mg | ORAL_TABLET | Freq: Four times a day (QID) | ORAL | Status: DC | PRN

## 2022-02-19 MED ORDER — ZOLPIDEM TARTRATE 5 MG PO TABS: 5.0000 mg | ORAL_TABLET | Freq: Every evening | ORAL | Status: DC | PRN

## 2022-02-19 MED ORDER — OXYTOCIN BOLUS FROM INFUSION
333.0000 mL | Freq: Once | INTRAVENOUS | Status: AC
  Administered 2022-02-20: 333 mL via INTRAVENOUS

## 2022-02-19 MED ORDER — ONDANSETRON HCL 4 MG/2ML IJ SOLN
4.0000 mg | Freq: Four times a day (QID) | INTRAMUSCULAR | Status: DC | PRN
  Administered 2022-02-20: 4 mg via INTRAVENOUS
  Filled 2022-02-19: qty 2

## 2022-02-19 MED ORDER — OXYCODONE-ACETAMINOPHEN 5-325 MG PO TABS: 1.0000 | ORAL_TABLET | ORAL | Status: DC | PRN

## 2022-02-19 MED ORDER — SOD CITRATE-CITRIC ACID 500-334 MG/5ML PO SOLN
30.0000 mL | ORAL | Status: DC | PRN
  Filled 2022-02-19: qty 30

## 2022-02-19 MED ORDER — LACTATED RINGERS IV SOLN: 500.0000 mL | INTRAVENOUS | Status: DC | PRN

## 2022-02-19 MED ORDER — FENTANYL CITRATE (PF) 100 MCG/2ML IJ SOLN
50.0000 ug | INTRAMUSCULAR | Status: DC | PRN
  Administered 2022-02-19 – 2022-02-20 (×4): 100 ug via INTRAVENOUS
  Filled 2022-02-19 (×4): qty 2

## 2022-02-19 MED ORDER — LIDOCAINE HCL (PF) 1 % IJ SOLN: 30.0000 mL | INTRAMUSCULAR | Status: DC | PRN

## 2022-02-19 MED ORDER — TERBUTALINE SULFATE 1 MG/ML IJ SOLN: 0.2500 mg | Freq: Once | INTRAMUSCULAR | Status: DC | PRN

## 2022-02-19 MED ORDER — MISOPROSTOL 25 MCG QUARTER TABLET
25.0000 ug | ORAL_TABLET | Freq: Once | ORAL | Status: AC
  Administered 2022-02-19: 25 ug via VAGINAL
  Filled 2022-02-19: qty 1

## 2022-02-19 MED ORDER — OXYCODONE-ACETAMINOPHEN 5-325 MG PO TABS: 2.0000 | ORAL_TABLET | ORAL | Status: DC | PRN

## 2022-02-19 MED ORDER — FLEET ENEMA 7-19 GM/118ML RE ENEM: 1.0000 | ENEMA | RECTAL | Status: DC | PRN

## 2022-02-19 MED ORDER — LACTATED RINGERS IV SOLN: INTRAVENOUS | Status: DC

## 2022-02-19 NOTE — Plan of Care (Signed)
  Problem: Education: Goal: Knowledge of Childbirth will improve Outcome: Progressing Goal: Ability to make informed decisions regarding treatment and plan of care will improve Outcome: Progressing Goal: Ability to state and carry out methods to decrease the pain will improve Outcome: Progressing   

## 2022-02-19 NOTE — H&P (Cosign Needed Addendum)
OBSTETRIC ADMISSION HISTORY AND PHYSICAL  Alexandria [name] is a 21 y.o. female G1P0 with IUP at [redacted]w[redacted]d by LMP presenting for IOL PD. She reports +FMs, No LOF, no VB, no blurry vision, headaches or peripheral edema, and RUQ pain.  She plans on breast and bottle feeding. She request nexplanon for birth control. She received her prenatal care at [facility]   Dating: By LMP --->  Estimated Date of Delivery: 02/11/22  Sono:    @[redacted]w[redacted]d , CWD, normal anatomy, cephalic presentation, anterior lie, 2994g, 38% EFW   Prenatal History/Complications:  Intellectual delay  Anemia hgb 9.2, treated with Fe H/o asthma  A-, rhogam on 8/8  Past Medical History: Past Medical History:  Diagnosis Date   ADHD (attention deficit hyperactivity disorder)    Allergy    Anxiety    Asthma    Bipolar 1 disorder (HCC)    Development delay    Oppositional defiant disorder     Past Surgical History: Past Surgical History:  Procedure Laterality Date   NO PAST SURGERIES      Obstetrical History: OB History     Gravida  1   Para      Term      Preterm      AB      Living         SAB      IAB      Ectopic      Multiple      Live Births              Social History Social History   Socioeconomic History   Marital status: Single    Spouse name: Not on file   Number of children: Not on file   Years of education: Not on file   Highest education level: Not on file  Occupational History   Not on file  Tobacco Use   Smoking status: Never   Smokeless tobacco: Never  Vaping Use   Vaping Use: Never used  Substance and Sexual Activity   Alcohol use: No   Drug use: Not Currently    Types: Marijuana    Comment: smoked when 2 mths preg   Sexual activity: Yes    Birth control/protection: None  Other Topics Concern   Not on file  Social History Narrative   Not on file   Social Determinants of Health   Financial Resource Strain: Medium Risk (11/09/2021)   Overall Financial  Resource Strain (CARDIA)    Difficulty of Paying Living Expenses: Somewhat hard  Food Insecurity: Food Insecurity Present (02/19/2022)   Hunger Vital Sign    Worried About Running Out of Food in the Last Year: Sometimes true    Ran Out of Food in the Last Year: Sometimes true  Transportation Needs: No Transportation Needs (02/19/2022)   PRAPARE - 02/21/2022 (Medical): No    Lack of Transportation (Non-Medical): No  Physical Activity: Insufficiently Active (11/09/2021)   Exercise Vital Sign    Days of Exercise per Week: 4 days    Minutes of Exercise per Session: 20 min  Stress: No Stress Concern Present (11/09/2021)   01/10/2022 of Occupational Health - Occupational Stress Questionnaire    Feeling of Stress : Only a little  Social Connections: Moderately Isolated (11/09/2021)   Social Connection and Isolation Panel [NHANES]    Frequency of Communication with Friends and Family: More than three times a week    Frequency of Social Gatherings with  Friends and Family: Twice a week    Attends Religious Services: 1 to 4 times per year    Active Member of [employer] or Organizations: No    Attends Banker Meetings: Never    Marital Status: Never married    Family History: Family History  Problem Relation Age of Onset   ADD / ADHD Mother    Bipolar disorder Mother    Anxiety disorder Mother     Allergies: Allergies  Allergen Reactions   Lactose Intolerance (Gi) Nausea And Vomiting    Medications Prior to Admission  Medication Sig Dispense Refill Last Dose   acetaminophen (TYLENOL) 325 MG tablet Take 325 mg by mouth every 6 (six) hours as needed for moderate pain.      albuterol (PROVENTIL HFA;VENTOLIN HFA) 108 (90 BASE) MCG/ACT inhaler Inhale 2 puffs into the lungs every 6 (six) hours as needed for wheezing or shortness of breath. (Patient not taking: Reported on 02/16/2022)      aspirin 81 MG EC tablet Take 1 tablet (81 mg total) by mouth  daily. Swallow whole. 90 tablet 3    Blood Pressure Monitor MISC For regular home bp monitoring during pregnancy (Patient not taking: Reported on 02/13/2022) 1 each 0    calcium carbonate (TUMS - DOSED IN MG ELEMENTAL CALCIUM) 500 MG chewable tablet Chew 1 tablet by mouth daily.      cetirizine (ZYRTEC) 10 MG tablet Take 10 mg by mouth daily.      ferrous sulfate 325 (65 FE) MG tablet Take 1 tablet (325 mg total) by mouth every other day. 30 tablet 3    ipratropium (ATROVENT) 0.06 % nasal spray Place 1 spray into both nostrils 4 (four) times daily. (Patient not taking: Reported on 02/16/2022) 15 mL 1    ondansetron (ZOFRAN ODT) 4 MG disintegrating tablet Take 1 tablet (4 mg total) by mouth every 8 (eight) hours as needed for nausea or vomiting. (Patient not taking: Reported on 02/16/2022) 20 tablet 0    polyethylene glycol powder (GLYCOLAX/MIRALAX) 17 GM/SCOOP powder Take 17 g by mouth daily. (Patient not taking: Reported on 02/16/2022) 255 g 0    Prenatal Vit-Fe Fumarate-FA (PRENATAL VITAMIN PO) Take by mouth.        Review of Systems   All systems reviewed and negative except as stated in HPI  Blood pressure 120/76, pulse 88, temperature 98.3 F (36.8 C), temperature source Oral, resp. rate 18, height 5\' 3"  (1.6 m), weight 76.2 kg. General appearance: slowed mentation, uncomfortable  Lungs: clear to auscultation bilaterally Heart: regular rate and rhythm Abdomen: soft, non-tender; bowel sounds normal Extremities: Homans sign is negative, no sign of DVT Presentation: unsure Fetal monitoring Baseline 165, moderate variability, + accels, - decels  Uterine activity Uterine irritability, irregular contractions      Prenatal labs: ABO, Rh: --/--/PENDING (10/22 1957) Antibody: PENDING (10/22 1957) Rubella: 1.73 (04/10 1020) RPR: Non Reactive (07/12 0940)  HBsAg: Negative (04/10 1020)  HIV: Non Reactive (07/12 0940)  GBS: Negative/-- (09/26 1415)  1 hr Glucola 83 Genetic screening   low risk female  Anatomy 04-30-1985 wnl  Prenatal Transfer Tool  Maternal Diabetes: No Genetic Screening: Normal Maternal Ultrasounds/Referrals: Normal Fetal Ultrasounds or other Referrals:  None Maternal Substance Abuse:  No Significant Maternal Medications:  None Significant Maternal Lab Results:  Group B Strep negative and Rh negative Number of Prenatal Visits:greater than 3 verified prenatal visits Other Comments:  None  Results for orders placed or performed during the hospital encounter  of 02/19/22 (from the past 24 hour(s))  Type and screen   Collection Time: 02/19/22  7:57 PM  Result Value Ref Range   ABO/RH(D) PENDING    Antibody Screen PENDING    Sample Expiration      02/22/2022,2359 Performed at [facility], [place] [address], [place], [place] [address]   CBC   Collection Time: 02/19/22  8:00 PM  Result Value Ref Range   WBC 6.6 4.0 - 10.5 K/uL   RBC 3.39 (L) 3.87 - 5.11 MIL/uL   Hemoglobin 10.8 (L) 12.0 - 15.0 g/dL   HCT 31.4 (L) 36.0 - 46.0 %   MCV 92.6 80.0 - 100.0 fL   MCH 31.9 26.0 - 34.0 pg   MCHC 34.4 30.0 - 36.0 g/dL   RDW 15.9 (H) 11.5 - 15.5 %   Platelets 233 150 - 400 K/uL   nRBC 0.0 0.0 - 0.2 %    Patient Active Problem List   Diagnosis Date Noted   Post-dates pregnancy 02/19/2022   Anemia affecting pregnancy 11/10/2021   Supervision of normal first pregnancy 08/09/2021   Rh negative state in antepartum period 08/09/2021   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 G1P0 at [redacted]w[redacted]d here for IOL PD  #Labor:Cytotec 50/25, continue to monitor  #Pain: IV fentanyl, plan for epidural  #FWB: Cat 1 #ID:  GBS neg #MOF: breast and bottle #MOC:nexplanon #Circ:  Need to confirm   Lowry Ram, MD   02/19/2022, 8:26 PM  Attestation of Supervision of Student:  I confirm that I have verified the information documented in the  resident 's note and that I have also personally reperformed the history, physical exam and all medical decision making activities.  I have verified that all services and findings are accurately documented in this student's note; and I agree with management and plan as outlined in the documentation. I have also made any necessary editorial changes.   Laury Deep, [place] for [employer], [facility] 02/19/2022 9:13 PM

## 2022-02-20 ENCOUNTER — Inpatient Hospital Stay (HOSPITAL_COMMUNITY): Payer: Medicaid Other | Admitting: Anesthesiology

## 2022-02-20 ENCOUNTER — Other Ambulatory Visit: Payer: Medicaid Other

## 2022-02-20 ENCOUNTER — Encounter (HOSPITAL_COMMUNITY): Payer: Self-pay | Admitting: Family Medicine

## 2022-02-20 DIAGNOSIS — O480 Post-term pregnancy: Secondary | ICD-10-CM

## 2022-02-20 DIAGNOSIS — Z3A41 41 weeks gestation of pregnancy: Secondary | ICD-10-CM

## 2022-02-20 LAB — COMPREHENSIVE METABOLIC PANEL
ALT: 14 U/L (ref 0–44)
AST: 24 U/L (ref 15–41)
Albumin: 3.6 g/dL (ref 3.5–5.0)
Alkaline Phosphatase: 143 U/L — ABNORMAL HIGH (ref 38–126)
Anion gap: 15 (ref 5–15)
BUN: <5 mg/dL — ABNORMAL LOW (ref 6–20)
CO2: 18 mmol/L — ABNORMAL LOW (ref 22–32)
Calcium: 9.2 mg/dL (ref 8.9–10.3)
Chloride: 99 mmol/L (ref 98–111)
Creatinine, Ser: 0.57 mg/dL (ref 0.44–1.00)
GFR, Estimated: >60 mL/min (ref >60)
Glucose, Bld: 72 mg/dL (ref 70–99)
Potassium: 3.9 mmol/L (ref 3.5–5.1)
Sodium: 132 mmol/L — ABNORMAL LOW (ref 135–145)
Total Bilirubin: 0.8 mg/dL (ref 0.3–1.2)
Total Protein: 7.6 g/dL (ref 6.5–8.1)

## 2022-02-20 LAB — CBC
HCT: 42.1 % (ref 36.0–46.0)
Hemoglobin: 13.7 g/dL (ref 12.0–15.0)
MCH: 30.5 pg (ref 26.0–34.0)
MCHC: 32.5 g/dL (ref 30.0–36.0)
MCV: 93.8 fL (ref 80.0–100.0)
Platelets: 270 10*3/uL (ref 150–400)
RBC: 4.49 MIL/uL (ref 3.87–5.11)
RDW: 15.7 % — ABNORMAL HIGH (ref 11.5–15.5)
WBC: 14.6 10*3/uL — ABNORMAL HIGH (ref 4.0–10.5)
nRBC: 0 % (ref 0.0–0.2)

## 2022-02-20 LAB — RPR: RPR Ser Ql: NONREACTIVE

## 2022-02-20 MED ORDER — PHENYLEPHRINE 80 MCG/ML (10ML) SYRINGE FOR IV PUSH (FOR BLOOD PRESSURE SUPPORT): 80.0000 ug | PREFILLED_SYRINGE | INTRAVENOUS | Status: DC | PRN

## 2022-02-20 MED ORDER — MISOPROSTOL 200 MCG PO TABS
1000.0000 ug | ORAL_TABLET | Freq: Once | ORAL | Status: AC
  Administered 2022-02-20: 1000 ug via RECTAL

## 2022-02-20 MED ORDER — METHYLERGONOVINE MALEATE 0.2 MG/ML IJ SOLN
INTRAMUSCULAR | Status: AC
  Filled 2022-02-20: qty 1

## 2022-02-20 MED ORDER — DIPHENHYDRAMINE HCL 50 MG/ML IJ SOLN: 12.5000 mg | INTRAMUSCULAR | Status: DC | PRN

## 2022-02-20 MED ORDER — LACTATED RINGERS IV SOLN: 500.0000 mL | Freq: Once | INTRAVENOUS | Status: DC

## 2022-02-20 MED ORDER — TRANEXAMIC ACID-NACL 1000-0.7 MG/100ML-% IV SOLN
INTRAVENOUS | Status: AC
  Administered 2022-02-20: 1000 mg via INTRAVENOUS
  Filled 2022-02-20: qty 100

## 2022-02-20 MED ORDER — TRANEXAMIC ACID-NACL 1000-0.7 MG/100ML-% IV SOLN: 1000.0000 mg | INTRAVENOUS | Status: AC

## 2022-02-20 MED ORDER — EPHEDRINE 5 MG/ML INJ: 10.0000 mg | INTRAVENOUS | Status: DC | PRN

## 2022-02-20 MED ORDER — OXYTOCIN-SODIUM CHLORIDE 30-0.9 UT/500ML-% IV SOLN
1.0000 m[IU]/min | INTRAVENOUS | Status: DC
  Administered 2022-02-20: 2 m[IU]/min via INTRAVENOUS

## 2022-02-20 MED ORDER — TERBUTALINE SULFATE 1 MG/ML IJ SOLN: 0.2500 mg | Freq: Once | INTRAMUSCULAR | Status: DC | PRN

## 2022-02-20 MED ORDER — MISOPROSTOL 200 MCG PO TABS
ORAL_TABLET | ORAL | Status: AC
  Filled 2022-02-20: qty 5

## 2022-02-20 MED ORDER — FENTANYL-BUPIVACAINE-NACL 0.5-0.125-0.9 MG/250ML-% EP SOLN
12.0000 mL/h | EPIDURAL | Status: DC | PRN
  Administered 2022-02-20: 12 mL/h via EPIDURAL
  Filled 2022-02-20: qty 250

## 2022-02-20 MED ORDER — METHYLERGONOVINE MALEATE 0.2 MG/ML IJ SOLN
0.2000 mg | Freq: Once | INTRAMUSCULAR | Status: AC
  Administered 2022-02-20: 0.2 mg via INTRAMUSCULAR

## 2022-02-20 MED ORDER — LIDOCAINE HCL (PF) 1 % IJ SOLN
INTRAMUSCULAR | Status: DC | PRN
  Administered 2022-02-20 (×2): 4 mL via EPIDURAL

## 2022-02-20 NOTE — Progress Notes (Signed)
Labor Progress Note Alexandria [name] is a 21 y.o. G1P0 at [redacted]w[redacted]d presented for IOL for post-dates.   S: Patient is comfortable after a dose of fentanyl.  O:  BP 134/77   Pulse 85   Temp 98.7 F (37.1 C) (Oral)   Resp 17   Ht 5\' 3"  (1.6 m)   Wt 76.2 kg   LMP  (LMP Unknown)   BMI 29.76 kg/m  EFM: 120 baseline/mdoerate variability/+ accels, no decels Toco: q4 minutes, seems more comfortable  CVE: Dilation: 5 Effacement (%): 80 Cervical Position: Anterior Station: -2 Presentation: Vertex Exam by:: DR Thompson Grayer   A&P: 21 y.o. G1P0 [redacted]w[redacted]d IOL for PD.  #Labor: Progressing well. SVE 5/80/-2. S/p cytotec, s/p FB. SVE unchanged, will start pitocin 2x2. Consider AROM at next check, although technically difficult as patient as a lot of pain with cervical checks. #Pain: PRN IV, epidural if desires #FWB: cat I #GBS negative   #RH negative- Rhogam eval PP.  Lenoria Chime, MD Center for [facility], [place] 1:31 PM

## 2022-02-20 NOTE — Progress Notes (Signed)
Labor Progress Note Alexandria [name] is a 21 y.o. G1P0 at [redacted]w[redacted]d presented for IOL for post-dates.   S: Patient is vomting at bedside, getting Zofran. Feeling more uncomfortable with contractions. Does not want to get epidural.   O:  BP 120/73   Pulse 66   Temp 98.7 F (37.1 C) (Oral)   Resp 17   Ht 5\' 3"  (1.6 m)   Wt 76.2 kg   LMP  (LMP Unknown)   BMI 29.76 kg/m  EFM: 130 baseline/mdoerate variability/+ accels, no decels Toco: q2-4 minutes, breathing heavilty through contractions  CVE: Dilation: 5 Effacement (%): 80 Cervical Position: Posterior Station: -2 Presentation: Vertex Exam by:: DR Thompson Grayer   A&P: 21 y.o. G1P0 [redacted]w[redacted]d IOL for PD.  #Labor: Progressing well. SVE 5/80/-2. Stil contracting off of the cytotec, s/p FB. Consider expectant management, if contractions space out could inititate pitocin. #Pain: PRN IV, epidural if desires #FWB: cat I #GBS negative   #RH negative- Rhogam eval PP.  Lenoria Chime, MD Center for [facility] 9:29 AM

## 2022-02-20 NOTE — Anesthesia Preprocedure Evaluation (Signed)
Anesthesia Evaluation  Patient identified by MRN, date of birth, ID band Patient awake    Reviewed: Allergy & Precautions, Patient's Chart, lab work & pertinent test results  History of Anesthesia Complications Negative for: history of anesthetic complications  Airway Mallampati: II  TM Distance: >3 FB Neck ROM: Full    Dental no notable dental hx.    Pulmonary asthma ,    Pulmonary exam normal        Cardiovascular negative cardio ROS Normal cardiovascular exam     Neuro/Psych Anxiety Depression Bipolar Disorder    GI/Hepatic negative GI ROS, Neg liver ROS,   Endo/Other  negative endocrine ROS  Renal/GU negative Renal ROS  negative genitourinary   Musculoskeletal negative musculoskeletal ROS (+)   Abdominal   Peds  Hematology negative hematology ROS (+)   Anesthesia Other Findings Day of surgery medications reviewed with patient.  Reproductive/Obstetrics (+) Pregnancy                             Anesthesia Physical Anesthesia Plan  ASA: 2  Anesthesia Plan: Epidural   Post-op Pain Management:    Induction:   PONV Risk Score and Plan: Treatment may vary due to age or medical condition  Airway Management Planned: Natural Airway  Additional Equipment: Fetal Monitoring  Intra-op Plan:   Post-operative Plan:   Informed Consent: I have reviewed the patients History and Physical, chart, labs and discussed the procedure including the risks, benefits and alternatives for the proposed anesthesia with the patient or authorized representative who has indicated his/her understanding and acceptance.       Plan Discussed with:   Anesthesia Plan Comments:         Anesthesia Quick Evaluation

## 2022-02-20 NOTE — Anesthesia Procedure Notes (Signed)
Epidural Patient location during procedure: OB Start time: 02/20/2022 4:29 PM End time: 02/20/2022 4:32 PM  Staffing Anesthesiologist: Brennan Bailey, MD Performed: anesthesiologist   Preanesthetic Checklist Completed: patient identified, IV checked, risks and benefits discussed, monitors and equipment checked, pre-op evaluation and timeout performed  Epidural Patient position: sitting Prep: DuraPrep and site prepped and draped Patient monitoring: continuous pulse ox, blood pressure and heart rate Approach: midline Location: L3-L4 Injection technique: LOR air  Needle:  Needle type: Tuohy  Needle gauge: 17 G Needle length: 9 cm Needle insertion depth: 5 cm Catheter type: closed end flexible Catheter size: 19 Gauge Catheter at skin depth: 10 cm Test dose: negative and Other (1% lidocaine)  Assessment Events: blood not aspirated, injection not painful, no injection resistance, no paresthesia and negative IV test  Additional Notes Patient identified. Risks, benefits, and alternatives discussed with patient including but not limited to bleeding, infection, nerve damage, paralysis, failed block, incomplete pain control, headache, blood pressure changes, nausea, vomiting, reactions to medication, itching, and postpartum back pain. Confirmed with bedside nurse the patient's most recent platelet count. Confirmed with patient that they are not currently taking any anticoagulation, have any bleeding history, or any family history of bleeding disorders. Patient expressed understanding and wished to proceed. All questions were answered. Sterile technique was used throughout the entire procedure. Please see nursing notes for vital signs.   Crisp LOR on first pass. Test dose was given through epidural catheter and negative prior to continuing to dose epidural or start infusion. Warning signs of high block given to the patient including shortness of breath, tingling/numbness in hands, complete  motor block, or any concerning symptoms with instructions to call for help. Patient was given instructions on fall risk and not to get out of bed. All questions and concerns addressed with instructions to call with any issues or inadequate analgesia.  Reason for block:procedure for pain

## 2022-02-20 NOTE — Progress Notes (Signed)
Labor Progress Note Alexandria [name] is a 21 y.o. G1P0 at [redacted]w[redacted]d presented for IOL for post-dates.   S: Patient is more uncomfortable now on pitocin. Wanting to be rechecked. Still does not want epidural  O:  BP (!) 122/90   Pulse 80   Temp 98.7 F (37.1 C) (Oral)   Resp 17   Ht 5\' 3"  (1.6 m)   Wt 76.2 kg   LMP  (LMP Unknown)   BMI 29.76 kg/m  EFM: 120 baseline/mdoerate variability/+ accels, no decels Toco: q4 minutes, seems more comfortable  CVE: Dilation: 5 Effacement (%): 80 Cervical Position: Anterior Station: -2 Presentation: Vertex Exam by:: Darcella Shiffman   A&P: 21 y.o. G1P0 [redacted]w[redacted]d IOL for PD.  #Labor: Progressing well. SVE 5/80/-2. S/p cytotec, s/p FB. SVE unchanged, now on pit of 6. Consider AROM at future check, although technically difficult as patient as a lot of pain with cervical checks. #Pain: PRN IV, epidural if desires #FWB: cat I #GBS negative   #RH negative- Rhogam eval PP.  Lenoria Chime, MD Center for [facility], [place] [facility]

## 2022-02-20 NOTE — Discharge Summary (Signed)
Postpartum Discharge Summary  Date of Service updated- yes     Patient Name: Alexandria [name] DOB: 10-22-2000 MRN: [id number]  Date of admission: 02/19/2022 Delivery date:02/20/2022  Delivering provider: Lowry Ram  Date of discharge: 02/22/2022  Admitting diagnosis: Post-dates pregnancy [O48.0] Intrauterine pregnancy: [redacted]w[redacted]d    Secondary diagnosis:  Principal Problem:   Post-dates pregnancy Active Problems:   Mild intellectual disability   Supervision of normal first pregnancy   Rh negative state in antepartum period   Anemia affecting pregnancy   Vaginal delivery   PPH (postpartum hemorrhage)  Additional problems: none    Discharge diagnosis: Term Pregnancy Delivered, Gestational Hypertension, and PPH                                              Post partum procedures:rhogam Augmentation: Pitocin, Cytotec, and IP Foley Complications: None  Hospital course: Induction of Labor With Vaginal Delivery   21y.o. yo G1P1001 at 447w2das admitted to the hospital 02/19/2022 for induction of labor.  Indication for induction: Postdates.  Patient had an labor course complicated by intrapartum hypertension. Had a PPBelzonit delivery, with blood loss just under 100066membrane Rupture Time/Date: 7:40 PM ,02/20/2022   Delivery Method:Vaginal, Spontaneous  Episiotomy: None  Lacerations:  2nd degree;Perineal  Details of delivery can be found in separate delivery note.  Patient had a postpartum course that was uncomplicated. Patient is discharged home 02/22/22.  Newborn Data: Birth date:02/20/2022  Birth time:8:57 PM  Gender:Female  Living status:Living  Apgars:6 ,8  Weight:3390 g   Magnesium Sulfate received: No BMZ received: No Rhophylac:Yes MMR:N/A, Rubella T-DaP:Given prenatally Flu: offer flu shot Transfusion:No  Physical exam  Vitals:   02/21/22 1003 02/21/22 1400 02/21/22 2118 02/22/22 0645  BP: 125/85 109/62 110/70 116/72  Pulse: 92 94  87  Resp: '18 16 18 17   ' Temp: 98.2 F (36.8 C) 98.2 F (36.8 C) 98.1 F (36.7 C) 97.9 F (36.6 C)  TempSrc: Oral Oral Oral Oral  SpO2:  100% 100% 99%  Weight:      Height:       General: alert, cooperative, and no distress Lochia: appropriate Uterine Fundus: firm Incision: N/A DVT Evaluation: No evidence of DVT seen on physical exam. Labs: Lab Results  Component Value Date   WBC 13.8 (H) 02/21/2022   HGB 10.1 (L) 02/21/2022   HCT 29.5 (L) 02/21/2022   MCV 91.6 02/21/2022   PLT 220 02/21/2022      Latest Ref Rng & Units 02/20/2022    5:12 PM  CMP  Glucose 70 - 99 mg/dL 72   BUN 6 - 20 mg/dL <5   Creatinine 0.44 - 1.00 mg/dL 0.57   Sodium 135 - 145 mmol/L 132   Potassium 3.5 - 5.1 mmol/L 3.9   Chloride 98 - 111 mmol/L 99   CO2 22 - 32 mmol/L 18   Calcium 8.9 - 10.3 mg/dL 9.2   Total Protein 6.5 - 8.1 g/dL 7.6   Total Bilirubin 0.3 - 1.2 mg/dL 0.8   Alkaline Phos 38 - 126 U/L 143   AST 15 - 41 U/L 24   ALT 0 - 44 U/L 14    Edinburgh Score:     No data to display           After visit meds:  Allergies as of 02/22/2022  Reactions   Lactose Intolerance (gi) Nausea And Vomiting        Medication List     STOP taking these medications    acetaminophen 325 MG tablet Commonly known as: TYLENOL   aspirin EC 81 MG tablet   Blood Pressure Monitor Misc   calcium carbonate 500 MG chewable tablet Commonly known as: TUMS - dosed in mg elemental calcium   ondansetron 4 MG disintegrating tablet Commonly known as: Zofran ODT   polyethylene glycol powder 17 GM/SCOOP powder Commonly known as: GLYCOLAX/MIRALAX       TAKE these medications    albuterol 108 (90 Base) MCG/ACT inhaler Commonly known as: VENTOLIN HFA Inhale 2 puffs into the lungs every 6 (six) hours as needed for wheezing or shortness of breath.   cetirizine 10 MG tablet Commonly known as: ZYRTEC Take 10 mg by mouth daily.   ferrous sulfate 325 (65 FE) MG tablet Take 1 tablet (325 mg total) by mouth  every other day.   ipratropium 0.06 % nasal spray Commonly known as: Atrovent Place 1 spray into both nostrils 4 (four) times daily.   PRENATAL VITAMIN PO Take by mouth.         Discharge home in stable condition Infant Feeding: Bottle and Breast Infant Disposition:home with mother Discharge instruction: per After Visit Summary and Postpartum booklet. Activity: Advance as tolerated. Pelvic rest for 6 weeks.  Diet: routine diet Future Appointments: Future Appointments  Date Time Provider [place]  02/28/2022 10:30 AM [facility] NURSE [facility]  03/21/2022  2:10 PM Roma Schanz, CNM [facility]   Follow up Visit:  The following message was sent to FT by Mikki Santee, MD  Please schedule this patient for a In person postpartum visit in 4 weeks with the following provider: Any provider. Additional Postpartum F/U:BP check 1 week  Low risk pregnancy complicated by: HTN - intrapartum Delivery mode:  Vaginal, Spontaneous  Anticipated Birth Control:  PP Nexplanon planned   [name], [place] for [place] 02/22/2022

## 2022-02-21 LAB — CBC
HCT: 29.5 % — ABNORMAL LOW (ref 36.0–46.0)
Hemoglobin: 10.1 g/dL — ABNORMAL LOW (ref 12.0–15.0)
MCH: 31.4 pg (ref 26.0–34.0)
MCHC: 34.2 g/dL (ref 30.0–36.0)
MCV: 91.6 fL (ref 80.0–100.0)
Platelets: 220 10*3/uL (ref 150–400)
RBC: 3.22 MIL/uL — ABNORMAL LOW (ref 3.87–5.11)
RDW: 15.7 % — ABNORMAL HIGH (ref 11.5–15.5)
WBC: 13.8 10*3/uL — ABNORMAL HIGH (ref 4.0–10.5)
nRBC: 0 % (ref 0.0–0.2)

## 2022-02-21 MED ORDER — SIMETHICONE 80 MG PO CHEW: 80.0000 mg | CHEWABLE_TABLET | ORAL | Status: DC | PRN

## 2022-02-21 MED ORDER — IBUPROFEN 600 MG PO TABS
600.0000 mg | ORAL_TABLET | Freq: Four times a day (QID) | ORAL | Status: DC
  Administered 2022-02-21 – 2022-02-22 (×6): 600 mg via ORAL
  Filled 2022-02-21 (×5): qty 1

## 2022-02-21 MED ORDER — DIPHENHYDRAMINE HCL 25 MG PO CAPS: 25.0000 mg | ORAL_CAPSULE | Freq: Four times a day (QID) | ORAL | Status: DC | PRN

## 2022-02-21 MED ORDER — SENNOSIDES-DOCUSATE SODIUM 8.6-50 MG PO TABS
2.0000 | ORAL_TABLET | Freq: Every day | ORAL | Status: DC
  Filled 2022-02-21 (×2): qty 2

## 2022-02-21 MED ORDER — ACETAMINOPHEN 325 MG PO TABS
650.0000 mg | ORAL_TABLET | ORAL | Status: DC | PRN
  Administered 2022-02-21 – 2022-02-22 (×2): 650 mg via ORAL
  Filled 2022-02-21 (×2): qty 2

## 2022-02-21 MED ORDER — FUROSEMIDE 20 MG PO TABS
20.0000 mg | ORAL_TABLET | Freq: Every day | ORAL | Status: DC
  Administered 2022-02-21 – 2022-02-22 (×2): 20 mg via ORAL
  Filled 2022-02-21 (×2): qty 1

## 2022-02-21 MED ORDER — ZOLPIDEM TARTRATE 5 MG PO TABS: 5.0000 mg | ORAL_TABLET | Freq: Every evening | ORAL | Status: DC | PRN

## 2022-02-21 MED ORDER — TETANUS-DIPHTH-ACELL PERTUSSIS 5-2.5-18.5 LF-MCG/0.5 IM SUSY: 0.5000 mL | PREFILLED_SYRINGE | Freq: Once | INTRAMUSCULAR | Status: DC

## 2022-02-21 MED ORDER — PRENATAL MULTIVITAMIN CH
1.0000 | ORAL_TABLET | Freq: Every day | ORAL | Status: DC
  Administered 2022-02-21 – 2022-02-22 (×2): 1 via ORAL
  Filled 2022-02-21 (×2): qty 1

## 2022-02-21 MED ORDER — WITCH HAZEL-GLYCERIN EX PADS: 1.0000 | MEDICATED_PAD | CUTANEOUS | Status: DC | PRN

## 2022-02-21 MED ORDER — FERROUS SULFATE 325 (65 FE) MG PO TABS
325.0000 mg | ORAL_TABLET | ORAL | Status: DC
  Administered 2022-02-21: 325 mg via ORAL
  Filled 2022-02-21: qty 1

## 2022-02-21 MED ORDER — RHO D IMMUNE GLOBULIN 1500 UNIT/2ML IJ SOSY
300.0000 ug | PREFILLED_SYRINGE | Freq: Once | INTRAMUSCULAR | Status: AC
  Administered 2022-02-21: 300 ug via INTRAVENOUS
  Filled 2022-02-21: qty 2

## 2022-02-21 MED ORDER — ONDANSETRON HCL 4 MG PO TABS: 4.0000 mg | ORAL_TABLET | ORAL | Status: DC | PRN

## 2022-02-21 MED ORDER — DIBUCAINE (PERIANAL) 1 % EX OINT: 1.0000 | TOPICAL_OINTMENT | CUTANEOUS | Status: DC | PRN

## 2022-02-21 MED ORDER — ONDANSETRON HCL 4 MG/2ML IJ SOLN: 4.0000 mg | INTRAMUSCULAR | Status: DC | PRN

## 2022-02-21 MED ORDER — COCONUT OIL OIL: 1.0000 | TOPICAL_OIL | Status: DC | PRN

## 2022-02-21 MED ORDER — BENZOCAINE-MENTHOL 20-0.5 % EX AERO
1.0000 | INHALATION_SPRAY | CUTANEOUS | Status: DC | PRN
  Filled 2022-02-21: qty 56

## 2022-02-21 NOTE — Lactation Note (Signed)
This note was copied from a baby's chart. Lactation Consultation Note  Patient Name: Alexandria [name] PTWSF'K Date: 02/21/2022 Reason for consult: Follow-up assessment;Mother's request;1st time breastfeeding Age:19 hours Birth Parent feeding choice is breast and formula feeding infant. LC changed two void diapers and one stool diaper while in the room. Per Birth Parent , infant latch well on her right  breast but not her left  breast. Would like help latching infant on her left breast. Birth Parent latched infant on her left breast using the football hold position, LC worked on positioning and alignment infant being chest to breast, infant sustained latch and actively BF for 20 minutes and appeared content after the feeding.  Birth Parent will continue to breastfeed infant according to hunger cues, on demand, 8 to 12+ times within 24 hours, STS.  Birth Parent knows to call [place] for further latch assistance if needed. Birth Parent will latch infant first for every feeding before supplementing infant with formula. Maternal Data    Feeding Mother's Current Feeding Choice: Breast Milk and Formula Nipple Type: Slow - flow  LATCH Score Latch: Grasps breast easily, tongue down, lips flanged, rhythmical sucking.  Audible Swallowing: Spontaneous and intermittent  Type of Nipple: Flat (Given hand pump to pre-pump breast prior to latching infant.)  Comfort (Breast/Nipple): Soft / non-tender  Hold (Positioning): Assistance needed to correctly position infant at breast and maintain latch.  LATCH Score: 8   Lactation Tools Discussed/Used    Interventions Interventions: Assisted with latch;Skin to skin;Breast compression;Adjust position;Support pillows;Position options;Education;Hand express  Discharge    Consult Status Consult Status: Follow-up Date: 02/22/22 Follow-up type: In-patient    Vicente Serene 02/21/2022, 8:10 PM

## 2022-02-21 NOTE — Anesthesia Postprocedure Evaluation (Signed)
Anesthesia Post Note  Patient: Alexandria [name]  Procedure(s) Performed: AN AD [place]     Patient location during evaluation: Mother Baby Anesthesia Type: Epidural Level of consciousness: awake and alert and oriented Pain management: satisfactory to patient Vital Signs Assessment: post-procedure vital signs reviewed and stable Respiratory status: respiratory function stable Cardiovascular status: stable Postop Assessment: no headache, no backache, epidural receding, patient able to bend at knees, no signs of nausea or vomiting, adequate PO intake and able to ambulate Anesthetic complications: no   No notable events documented.  Last Vitals:  Vitals:   02/21/22 0130 02/21/22 0525  BP: 116/79 118/77  Pulse: 85 71  Resp:  18  Temp: 37.5 C 37.1 C  SpO2: 100% 100%    Last Pain:  Vitals:   02/21/22 0620  TempSrc:   PainSc: Asleep   Pain Goal:                   [name]

## 2022-02-21 NOTE — Lactation Note (Signed)
This note was copied from a baby's chart. Lactation Consultation Note  Patient Name: Alexandria [name] DTOIZ'T Date: 02/21/2022 Reason for consult: Initial assessment;Primapara;Term Age:19 hours Mom is breast/formula. Baby sleeping in bed. Has had formula Encouraged mom to BF before giving formula. Taught hand expression. Noted colostrum. Mom stated she started leaking in her 3rd trimester. Praised mom. Mom will need a lot of assistance when starting to BF. Mom encouraged to feed baby 8-12 times/24 hours and with feeding cues.  Encouraged to wake baby in 3 hrs if he hasn't cued to feed before then. Encouraged to call for assistance or questions. MGM at bedside.  Maternal Data    Feeding Nipple Type: Slow - flow  LATCH Score       Type of Nipple: Flat (everts w/stimulation)  Comfort (Breast/Nipple): Soft / non-tender         Lactation Tools Discussed/Used Tools: Pump  Interventions Interventions: Hand express;LC Services brochure  Discharge    Consult Status Consult Status: Follow-up Date: 02/21/22 Follow-up type: In-patient    Ian Cavey, Elta [place] 02/21/2022, 1:41 AM

## 2022-02-21 NOTE — Clinical Social Work Maternal (Signed)
CLINICAL SOCIAL WORK MATERNAL/CHILD NOTE   Patient Details  Name: Alexandria [name] MRN: [id number] Date of Birth: 05/04/2000   Date:  02/21/2022   Clinical Social Worker Initiating Note:  Anyiah Coverdale      Date/Time: Initiated:  02/21/22/1526      Child's Name:  Alexandria [name]    Biological Parents:  Mother (Keiosha Gawron)    Need for Interpreter:  None    Reason for Referral:  Behavioral Health Concerns    Address:  [address] [place] [address]    Phone number:  [phone] (home)      Additional phone number:    Household Members/Support Persons (HM/SP):   Household Member/Support Person 1     HM/SP Name Relationship DOB or Age  HM/SP -1 April Beidler mother 10/30/1980  HM/SP -2     HM/SP -3     HM/SP -4     HM/SP -5     HM/SP -6     HM/SP -7     HM/SP -8         Natural Supports (not living in the home):  Extended Family    Professional Supports: Case Manager/Social Worker ([facility] Worker- Melissa Hardy)    Employment: Unemployed    Type of Work:      Education:  High school graduate    Homebound arranged:     Financial Resources:  Medicaid    Other Resources:  Food Stamps  , WIC    Cultural/Religious Considerations Which May Impact Care:     Strengths:  Compliance with medical plan  , Pediatrician chosen, Home prepared for child      Psychotropic Medications:          Pediatrician:    [place]   Pediatrician List:       [place]   [place]   [place] [facility]  [place]   [place]       Pediatrician Fax Number:     Risk Factors/Current Problems:  Intellectual Development Disorder  , Mental Health Concerns      Cognitive State:  Able to Concentrate      Mood/Affect:  Calm  , Comfortable      CSW Assessment: CSW received consult for Mother with intellectual disabilities. CSW met with MOB to  complete assessment and offer support.  When CSW entered the room MOB was lying in bed holding the infant. MOB's mom was sitting on the couch. CSW offered privacy MOB granted CSW permission to speak with her mom present. CSW introduced self, CSW role and reason for visit. MOB ws agreeable to visit. CSW inquired about how MOB was feeling, MOB reported she was a little sore from the stitches as well as in her back where the epidural was placed. MOB reported the delivery went well MOB reported she waited until she was 6cm before getting the epidural. CSW listened while MOB explained her delivery experience. CSW confirmed address and phone number on file with MOB.    MOB's mom was engaged and answered most of CSW's questions as MOB allowed. CSW inquired about noted MH hx MOB mom reported MOB had been diagnosed with Anxiety, Depression, Bipolar, ADHD and ODD. She also reported that MOB had some intellectual delays. MOB agreed. MOB mom reported MOB receives SSI benefits due to her Mental health as well and her intellectual delays. CSW inquired about medications, MOB's mom reported she is not currently taking any medications, prior   to pregnancy MOB was taking Concerta and clonidine. MOB's mom reported she wanted to do more research to see if they were safe for her to take while breastfeeding. MOB's mom reported she was not on medication for the Bipolar, but had a stable mood throughout pregnancy. CSW assessed for safety, MOB denied SI or HI. MOB identified her supports are her mom, cousin, and sister. CSW provided education regarding the baby blues period vs. perinatal mood disorders, discussed treatment and gave resources for mental health follow up if concerns arise.  CSW recommends self-evaluation during the postpartum time period using the New Mom Checklist from Postpartum Progress and encouraged MOB to contact a medical professional if symptoms are noted at any time.    CSW provided review of Sudden Infant Death Syndrome (SIDS) precautions. While review  SID information MOB reported she was getting sleepy and asked her mom to hold the infant. CSW praised MOB for recognzing that and being sure not to co sleep.  MOB's mom reported MOB has a case manager through [facility]- Melissa Hardy who will follow MOB and the infant until age 2. MOB reported they have all necessary items for the infant including a Pack n Play provided by [employer]. CSW inquired about other support when going  home. MOB reported she lives with her mom and she will help. MOB's mom reported she works 3 days a week and a cousin will be available to help while she is a work. CSW identifies no further need for intervention and no barriers to discharge at this time.    CSW Plan/Description:  No Further Intervention Required/No Barriers to Discharge, Sudden Infant Death Syndrome (SIDS) Education, Perinatal Mood and Anxiety Disorder (PMADs) Education      Neal Trulson M Koron Godeaux, LCSW 02/21/2022, 3:36 PM   

## 2022-02-21 NOTE — Progress Notes (Signed)
POSTPARTUM PROGRESS NOTE  Post Partum Day 1  Subjective:  Alexandria [name] is a 21 y.o. G1P1001 s/p VD at [redacted]w[redacted]d.  She was sleeping during my evaluation. I checked in with her mom who was at the bedside. No acute events overnight. She denies any problems with ambulating, voiding or po intake. Denies nausea or vomiting.  Pain is well controlled.  Lochia is minimal.  Objective: Blood pressure 118/77, pulse 71, temperature 98.8 F (37.1 C), temperature source Oral, resp. rate 18, height 5\' 3"  (1.6 m), weight 76.2 kg, SpO2 100 %, unknown if currently breastfeeding.  Physical Exam:  General: alert, cooperative and no distress Chest: no respiratory distress Heart:regular rate, distal pulses intact Abdomen: soft, nontender,  Uterine Fundus: firm, appropriately tender DVT Evaluation: No calf swelling or tenderness Extremities: no edema Skin: warm, dry  Recent Labs    02/20/22 1712 02/21/22 0525  HGB 13.7 10.1*  HCT 42.1 29.5*    Assessment/Plan: Alexandria [name] is a 21 y.o. G1P1001 s/p VD at [redacted]w[redacted]d   PPD#1 - Doing well  Routine postpartum care PPH: blood loss close to 1046ml, CBC am 10.1. will place on PO iron. Contraception: nexplanon prior to discharge Feeding: both Dispo: Plan for discharge tomorrow.   LOS: 2 days   [name], [place] for [place] 02/21/2022

## 2022-02-22 ENCOUNTER — Encounter (HOSPITAL_COMMUNITY): Payer: Self-pay | Admitting: Family Medicine

## 2022-02-22 DIAGNOSIS — Z975 Presence of (intrauterine) contraceptive device: Secondary | ICD-10-CM

## 2022-02-22 DIAGNOSIS — Z30017 Encounter for initial prescription of implantable subdermal contraceptive: Secondary | ICD-10-CM

## 2022-02-22 HISTORY — PX: NEXPLANON TRAY: NUR84248

## 2022-02-22 LAB — RH IG WORKUP (INCLUDES ABO/RH)
Fetal Screen: NEGATIVE
Gestational Age(Wks): 41.2
Unit division: 0

## 2022-02-22 MED ORDER — LIDOCAINE HCL 1 % IJ SOLN
0.0000 mL | Freq: Once | INTRAMUSCULAR | Status: AC | PRN
  Administered 2022-02-22: 3 mL via INTRADERMAL
  Filled 2022-02-22: qty 20

## 2022-02-22 MED ORDER — ETONOGESTREL 68 MG ~~LOC~~ IMPL
68.0000 mg | DRUG_IMPLANT | Freq: Once | SUBCUTANEOUS | Status: AC
  Administered 2022-02-22: 68 mg via SUBCUTANEOUS
  Filled 2022-02-22: qty 1

## 2022-02-22 NOTE — Discharge Instructions (Signed)
-   Continue your prenatal vitamins especially if breastfeeding - Try to eat iron rich food. - Take iron supplement as prescribed every other day. Take along with fruit or orange juice, avoid taking within 30 mins of eating dairy (milk, cheese) - Take over the counter tylenol (500mg ) or ibuprofen (200mg ) three times a day as needed for cramping/pain. - continue blood pressure medication. - Please come back to [facility] if you notice persistently elevated blood pressures or you start to have a headache, that doesn't get better with medications (tylenol and ibuprofen), rest (4hrs of sleep) and drinking water.

## 2022-02-22 NOTE — Procedures (Signed)
Post-Placental Nexplanon Insertion Procedure Note  Patient was identified. Informed consent was signed, signed copy in chart. A time-out was performed.    The insertion site was identified 8-10 cm (3-4 inches) from the medial epicondyle of the humerus and 3-5 cm (1.25-2 inches) posterior to (below) the sulcus (groove) between the biceps and triceps muscles of the patient's left arm and marked. The site was prepped and draped in the usual sterile fashion. Pt was prepped with alcohol swab and then injected with 3 cc of 1% lidocaine. The site was prepped with betadine. Nexplanon removed form packaging,  Device confirmed in needle, then inserted full length of needle and withdrawn per handbook instructions. Provider and patient verified presence of the implant in the woman's arm by palpation. Pt insertion site was covered with steristrips/adhesive bandage and pressure bandage. There was minimal blood loss. Patient tolerated procedure well.  Patient was given post procedure instructions and Nexplanon user card with expiration date. Condoms were recommended for STI prevention. Patient was asked to keep the pressure dressing on for 24 hours to minimize bruising and keep the adhesive bandage on for 3-5 days. The patient verbalized understanding of the plan of care and agrees.   Lot # [id number] Expiration Date OCT [place], [place] Fellow, Faculty practice [place] for [facility] 02/22/22  12:31 PM

## 2022-02-28 ENCOUNTER — Ambulatory Visit (INDEPENDENT_AMBULATORY_CARE_PROVIDER_SITE_OTHER): Payer: Medicaid Other | Admitting: *Deleted

## 2022-02-28 VITALS — BP 108/73 | HR 93

## 2022-02-28 DIAGNOSIS — Z0130 Encounter for examination of blood pressure without abnormal findings: Secondary | ICD-10-CM

## 2022-02-28 NOTE — Progress Notes (Signed)
   NURSE VISIT- BLOOD PRESSURE CHECK  SUBJECTIVE:  Alexandria [name] is a 21 y.o. G27P1001 female here for BP check. She is postpartum, delivery date 02/20/22.     HYPERTENSION ROS:  Postpartum:  Severe headaches that don't go away with tylenol/other medicines: No  Visual changes (seeing spots/double/blurred vision) No  Severe pain under right breast breast or in center of upper chest No  Severe nausea/vomiting No  Taking medicines as instructed not applicable    OBJECTIVE:  BP 108/73 (BP Location: Right Arm, Patient Position: Sitting, Cuff Size: Normal)   Pulse 93   Breastfeeding Yes   Appearance alert, well appearing, and in no distress.  ASSESSMENT: Postpartum  blood pressure check  PLAN: Discussed with Knute Neu, CNM, [facility]   Recommendations: no changes needed   Follow-up: as scheduled   Alexandria [name]  02/28/2022 10:11 AM

## 2022-03-15 ENCOUNTER — Telehealth: Payer: Self-pay | Admitting: Advanced Practice Midwife

## 2022-03-15 NOTE — Telephone Encounter (Signed)
Pt's mother is asking for a referral to behavioral health. Pt is not attending to her child and only lays around. Please advise

## 2022-03-16 ENCOUNTER — Other Ambulatory Visit: Payer: Self-pay | Admitting: Advanced Practice Midwife

## 2022-03-16 DIAGNOSIS — F53 Postpartum depression: Secondary | ICD-10-CM

## 2022-03-16 DIAGNOSIS — F70 Mild intellectual disabilities: Secondary | ICD-10-CM

## 2022-03-16 DIAGNOSIS — R5381 Other malaise: Secondary | ICD-10-CM

## 2022-03-20 ENCOUNTER — Telehealth: Payer: Self-pay | Admitting: Clinical

## 2022-03-20 DIAGNOSIS — F902 Attention-deficit hyperactivity disorder, combined type: Secondary | ICD-10-CM

## 2022-03-20 DIAGNOSIS — F53 Postpartum depression: Secondary | ICD-10-CM

## 2022-03-20 DIAGNOSIS — F70 Mild intellectual disabilities: Secondary | ICD-10-CM

## 2022-03-20 NOTE — Telephone Encounter (Signed)
Pt agrees to virtual consult on 03/29/22; pt and pt's mother request pt to begin [facility] medication (previously on Concerta and Clonodine) to treat ADHD. Pt previously prescribed by [facility] and say pt aged out of the program; haven't been on medication since positive pregancy. Pt agrees to referral to psychiatry.

## 2022-03-21 ENCOUNTER — Encounter: Payer: Self-pay | Admitting: Women's Health

## 2022-03-21 ENCOUNTER — Ambulatory Visit (INDEPENDENT_AMBULATORY_CARE_PROVIDER_SITE_OTHER): Payer: Medicaid Other | Admitting: Women's Health

## 2022-03-21 DIAGNOSIS — Z8759 Personal history of other complications of pregnancy, childbirth and the puerperium: Secondary | ICD-10-CM

## 2022-03-21 DIAGNOSIS — F53 Postpartum depression: Secondary | ICD-10-CM | POA: Diagnosis not present

## 2022-03-21 DIAGNOSIS — Z1330 Encounter for screening examination for mental health and behavioral disorders, unspecified: Secondary | ICD-10-CM

## 2022-03-21 HISTORY — DX: Personal history of other complications of pregnancy, childbirth and the puerperium: Z87.59

## 2022-03-21 NOTE — BH Specialist Note (Signed)
Integrated Behavioral Health via Telemedicine Visit  03/29/2022 Alexandria [name] [id number]  Number of Integrated Behavioral Health Clinician visits: 1- Initial Visit  Session Start time: 1036   Session End time: 1126  Total time in minutes: 50   Referring Provider: Jacklyn [name], CNM Patient/Family location: Home [facility] Provider location: [name]  All persons participating in visit: Patient Alexandria [name] (and her mother with verbal consent from pt) and Alexandria [name] Alexandria [name]   Types of Service: Individual psychotherapy and Video visit  I connected with Alexandria [name] and/or Alexandria [name] Alexandria [name]'s mother via  Telephone or Engineer, civil (consulting)  (Video is Surveyor, mining) and verified that I am speaking with the correct person using two identifiers. Discussed confidentiality: Yes   I discussed the limitations of telemedicine and the availability of in person appointments.  Discussed there is a possibility of technology failure and discussed alternative modes of communication if that failure occurs.  I discussed that engaging in this telemedicine visit, they consent to the provision of behavioral healthcare and the services will be billed under their insurance.  Patient and/or legal guardian expressed understanding and consented to Telemedicine visit: Yes   Presenting Concerns: Patient and/or family reports the following symptoms/concerns: Pt feels "emotional at times" over FOB's hurtful words and behavior/lack of [name], as well as feeling nervous when baby cries. Pt's goals are to feel more confident caring for baby by herself and to go to college.  Duration of problem: Ongoing, with increase postpartum; Severity of problem:  moderately severe  Patient and/or Family's Strengths/Protective Factors: Social connections, Concrete supports in place (healthy food, safe environments, etc.), and  Sense of purpose  Goals Addressed: Patient will:  Reduce symptoms of: anxiety and mood instability   Increase knowledge and/or ability of: healthy habits and self-management skills   Demonstrate ability to: Increase healthy adjustment to current life circumstances, Increase adequate [name] systems for patient/family, and Increase motivation to adhere to plan of care  Progress towards Goals: Ongoing  Interventions: Interventions utilized:  Mindfulness or Management consultant, Psychoeducation and/or Health Education, and Link to [employer] Standardized Assessments completed: GAD-7 and PHQ 9  Patient and/or Family Response: Patient agrees with treatment plan.   Assessment: Patient currently experiencing Anxiety disorder, unspecified; (History of ADHD, MDD, Bipolar 1 disorder, ODD and Mild intellectual disabilty, all as previously diagnosed). .   Patient may benefit from psychoeducation and brief therapeutic interventions regarding coping with symptoms of anxiety and depression .  Plan: Follow up with behavioral health clinician on : Two weeks Behavioral recommendations:  -Continue taking iron pills as prescribed -Continue accepting referral for psychiatry -CALM relaxation breathing exercise twice daily (morning; at bedtime; as needed when feeling anxious).  -Go to [name] with mom to inquire about enrolling to begin degree in Early Childhood Education  --Continue prioritizing healthy self-care (regular meals, adequate rest; allowing practical help from supportive friends and family) -Consider new mom [name] group as needed at either [email or web address].net or [email or web address]   Referral(s): Integrated Art gallery manager (In Clinic), Community Mental Health Services (LME/Outside Clinic), and Community Resources:  Alexandria [name], Alexandria [name]  I discussed the assessment and treatment plan with the patient and/or parent/guardian. They were provided  an opportunity to ask questions and all were answered. They agreed with the plan and demonstrated an understanding of the instructions.   They were advised to call back or seek an in-person evaluation if the symptoms worsen or if the  condition fails to improve as anticipated.  Alexandria Lips, LCSW     03/29/2022   10:53 AM 02/13/2022   11:36 AM 11/09/2021    9:53 AM 08/10/2021   10:53 AM  Depression screen PHQ 2/9  Decreased Interest 3 0 0 0  Down, Depressed, Hopeless 1 1 0 1  PHQ - 2 Score 4 1 0 1  Altered sleeping 1 0 0 0  Tired, decreased energy 3 0 1 1  Change in appetite 0 0 1 0  Feeling bad or failure about yourself  0 0 0 0  Trouble concentrating 1 0 0 0  Moving slowly or fidgety/restless 0 0 0 0  Suicidal thoughts 0 0 0 0  PHQ-9 Score 9 1 2 2       03/29/2022   10:55 AM 02/13/2022   11:37 AM 11/09/2021    9:53 AM 08/10/2021   10:53 AM  GAD 7 : Generalized Anxiety Score  Nervous, Anxious, on Edge 0 0 0 0  Control/stop worrying 1 0 1 0  Worry too much - different things 3 0 1 0  Trouble relaxing 1 0 0 0  Restless 1 0 0 0  Easily annoyed or irritable 3 0 1 0  Afraid - awful might happen 1 0 0 0  Total GAD 7 Score 10 0 3 0

## 2022-03-21 NOTE — Progress Notes (Signed)
POSTPARTUM VISIT Patient name: Alexandria [name] MRN [id number]  Date of birth: 08-25-2000 Chief Complaint:   Postpartum Care  History of Present Illness:   Alexandria [name] is a 21 y.o. G63P1001 African American female being seen today for a postpartum visit. She is 4 weeks postpartum following a spontaneous vaginal delivery at 41.2 gestational weeks. IOL: yes, for postdates. Anesthesia: epidural.  Laceration: 2nd degree.  Complications: EBL 314HF, given TXA, methergine, cytotec, LUS sweep. Inpatient contraception: yes Nexplanon inserted 10/25 .   Pregnancy complicated by GHTN dx intrapartum Tobacco use: no. Substance use disorder: no. Last pap smear: <21yo and results were N/A. Next pap smear due: _0  No LMP recorded.  Postpartum course has been complicated by PPD . Bleeding flow about like a period, sometimes only spotting. Some small clots.  Bowel function is abnormal: some constipation . Bladder function is normal. Urinary incontinence? no, fecal incontinence? no Patient is not sexually active. Last sexual activity: prior to birth of baby. Desired contraception: PP Nexplanon placed. Patient does want a pregnancy in the future.  Desired family size is 1-2 children.   Upstream - 03/21/22 1427       Pregnancy Intention Screening   Does the patient want to become pregnant in the next year? No    Does the patient's partner want to become pregnant in the next year? No    Would the patient like to discuss contraceptive options today? No      Contraception Wrap Up   Current Method Hormonal Implant    End Method Hormonal Implant    Contraception Counseling Provided No            The pregnancy intention screening data noted above was reviewed. Potential methods of contraception were discussed. The patient elected to proceed with Hormonal Implant.  Edinburgh Postpartum Depression Screening: positive, says she sometimes thinks of harming herself when she sees other girls w/ their  baby's fathers, and her baby's father is not in the picture. Thinks of taking a lot of pills. Denies current thoughts of self-harm. Spoke w/ Alexandria [name]/IBH yesterday, has an appt w/ her on 11/29 w/ plans to get into psychiatry for med management (was on concerta and clonidine prior to pregnancy and wants to resume, but has aged out of previous provider). Lives w/ mom, who is great support for her.   Edinburgh Postnatal Depression Scale - 03/21/22 1431       Edinburgh Postnatal Depression Scale:  In the Past 7 Days   I have been able to laugh and see the funny side of things. 2    I have looked forward with enjoyment to things. 1    I have blamed myself unnecessarily when things went wrong. 0    I have been anxious or worried for no good reason. 2    I have felt scared or panicky for no good reason. 0    Things have been getting on top of me. 2    I have been so unhappy that I have had difficulty sleeping. 0    I have felt sad or miserable. 0    I have been so unhappy that I have been crying. 2    The thought of harming myself has occurred to me. 2    Edinburgh Postnatal Depression Scale Total 11                02/13/2022   11:37 AM 11/09/2021    9:53 AM 08/10/2021   10:53 AM  GAD 7 : Generalized Anxiety Score  Nervous, Anxious, on Edge 0 0 0  Control/stop worrying 0 1 0  Worry too much - different things 0 1 0  Trouble relaxing 0 0 0  Restless 0 0 0  Easily annoyed or irritable 0 1 0  Afraid - awful might happen 0 0 0  Total GAD 7 Score 0 3 0     Baby's course has been uncomplicated. Baby is feeding by breast and bottle: milk supply adequate. Infant has a pediatrician/family doctor? Yes.  Childcare strategy if returning to work/school: n/a-stay at home mom.  Pt has material needs met for her and baby: Yes.   Review of Systems:   Pertinent items are noted in HPI Denies Abnormal vaginal discharge w/ itching/odor/irritation, headaches, visual changes, shortness of breath, chest pain,  abdominal pain, severe nausea/vomiting, or problems with urination or bowel movements. Pertinent History Reviewed:  Reviewed past medical,surgical, obstetrical and family history.  Reviewed problem list, medications and allergies. OB History  Gravida Para Term Preterm AB Living  _0 SAB IAB Ectopic Multiple Live Births        0 1    # Outcome Date GA Lbr Len/2nd Weight Sex Delivery Anes PTL Lv  1 Term 02/20/22 [redacted]w[redacted]d/ 01:17 7 lb 7.6 oz (3.39 kg) M Vag-Spont EPI  LIV   Physical Assessment:   Vitals:   03/21/22 1426 03/21/22 1430  BP: 127/89 117/79  Pulse: 87 90  Weight: 134 lb (60.8 kg)   Height: 5' 5" (1.651 m)   Body mass index is 22.3 kg/m.       Physical Examination:   General appearance: alert, well appearing, and in no distress  Mental status: alert, oriented to person, place, and time  Skin: warm & dry   Cardiovascular: normal heart rate noted   Respiratory: normal respiratory effort, no distress   Breasts: deferred, no complaints   Abdomen: soft, non-tender   Pelvic: lac healing well, sutures still visible. Thin prep pap obtained: No  Rectal: not examined  Extremities: Edema: none   Chaperone:  Calandra Johnson          No results found for this or any previous visit (from the past 24 hour(s)).  Assessment & Plan:  1) Postpartum exam 2) 4 wks s/p spontaneous vaginal delivery after IOL for postdates 3) breast & bottle feeding> discussed is ok to continue breastfeeding when resumes meds 4) Depression screening 5) Contraception s/p Nexplanon insertion 10/25 6) PPD> w/ occ thoughts of self-harm, none currently. Agreed to notify her mom and go to ED if thoughts return and thinks she will act on them. 24hr crisis line given. Has appt w/ Alexandria [name]/IBH 11/29 and plans to get in w/ psychiatry to restart concerta and clonidine. [place] notifying her of occ thoughts of self-harm. JRoselyn Reefstates she [facility]. SDenton Meek should be able to get virtual appt quickly w/ him, and JRoselyn Reefis also going to contact pt w/ walk-in Urgent Care option as well in case she needs to be seen sooner.  Essential components of care per ACOG recommendations:  1.  Mood and well being:  If positive depression screen, discussed and plan developed.  If using tobacco we discussed reduction/cessation and risk of relapse If current substance abuse, we discussed and referral to local resources was offered.   2. Infant care and feeding:  If breastfeeding, discussed returning to work, pumping, breastfeeding-associated pain, guidance regarding return to fertility while lactating if not using another method. If needed, patient was provided with a letter to be allowed to pump q 2-3hrs to support lactation in a private location with access to a refrigerator to store breastmilk.   Recommended that all caregivers be immunized for flu, pertussis and other preventable communicable diseases If pt does not have material needs met for her/baby, referred to local resources for help obtaining these.  3. Sexuality, contraception and birth spacing Provided guidance regarding sexuality, management of dyspareunia, and resumption of intercourse Discussed avoiding interpregnancy interval <79mhs and recommended birth spacing of 18 months  4. Sleep and fatigue Discussed coping options for fatigue and sleep disruption Encouraged family/partner/community support of 4 hrs of uninterrupted sleep to help with mood and fatigue  5. Physical recovery  If pt had a C/S, assessed incisional pain and providing guidance on normal vs prolonged recovery If pt had a laceration, perineal healing and pain reviewed.  If urinary or fecal incontinence, discussed management and referred to PT or uro/gyn if indicated  Patient is safe to resume physical activity. Discussed attainment of healthy weight.  6.  Chronic disease management Discussed pregnancy  complications if any, and their implications for future childbearing and long-term maternal health. Review recommendations for prevention of recurrent pregnancy complications, such as 17 hydroxyprogesterone caproate to reduce risk for recurrent PTB not applicable, or aspirin to reduce risk of preeclampsia yes. Pt had GDM: no. If yes, 2hr GTT scheduled: not applicable. Reviewed medications and non-pregnant dosing including consideration of whether pt is breastfeeding using a reliable resource such as LactMed: yes Referred for f/u w/ PCP or subspecialist providers as indicated: yes  7. Health maintenance Mammogram at [id number] earlier if indicated Pap smears as indicated  Meds: No orders of the defined types were placed in this encounter.   Follow-up: Return in about 4 weeks (around 04/18/2022) for mood f/u in person .   No orders of the defined types were placed in this encounter.   [facility]11/21/2023 3:15 PM

## 2022-03-21 NOTE — Patient Instructions (Signed)
If you're in crisis please use our 24-Hour Crisis Hotline for immediate access to a clinician. 24 Hour Crisis Line: [phone]   Constipation Drink plenty of fluid, preferably water, throughout the day Eat foods high in fiber such as fruits, vegetables, and grains Exercise, such as walking, is a good way to keep your bowels regular Drink warm fluids, especially warm prune juice, or decaf coffee Eat a 1/2 cup of real oatmeal (not instant), 1/2 cup applesauce, and 1/2-1 cup warm prune juice every day If needed, you may take Colace (docusate sodium) stool softener once or twice a day to help keep the stool soft. If you are pregnant, wait until you are out of your first trimester (12-14 weeks of pregnancy) If you still are having problems with constipation, you may take Miralax once daily as needed to help keep your bowels regular.  If you are pregnant, wait until you are out of your first trimester (12-14 weeks of pregnancy)

## 2022-03-29 ENCOUNTER — Ambulatory Visit (INDEPENDENT_AMBULATORY_CARE_PROVIDER_SITE_OTHER): Payer: Medicaid Other | Admitting: Clinical

## 2022-03-29 DIAGNOSIS — F70 Mild intellectual disabilities: Secondary | ICD-10-CM

## 2022-03-29 DIAGNOSIS — F902 Attention-deficit hyperactivity disorder, combined type: Secondary | ICD-10-CM

## 2022-03-29 DIAGNOSIS — F419 Anxiety disorder, unspecified: Secondary | ICD-10-CM | POA: Diagnosis not present

## 2022-03-29 NOTE — Patient Instructions (Signed)
Center for [facility] Healthcare at [facility] [address] [place], [place] [address] [phone] (main office) [phone] [facility] office)  New Parent Support Groups www.[email or web address].net www.[email or web address] (Baby & Me group, etc.)  LIEAP (Low Income Energy Assistance Program) LowBlog.nl  [employer] (Low Income Automotive engineer) LittleDVDs.dk

## 2022-04-03 NOTE — BH Specialist Note (Signed)
Integrated Behavioral Health via Telemedicine Visit  04/12/2022 Alexandria [name] [id number]  Number of Integrated Behavioral Health Clinician visits: 2- Second Visit  Session Start time: 1053   Session End time: 1121  Total time in minutes: 28   Referring Provider: Jacklyn Shell, CNM Patient/Family location: [facility] Provider location: [facility]  All persons participating in visit: Patient Alexandria [name] and [facility]   Types of Service: Individual psychotherapy and Telephone visit  I connected with [name] and/or Lenon Ahmadi Rhoads's mother via  Telephone or Engineer, civil (consulting)  (Video is Surveyor, mining) and verified that I am speaking with the correct person using two identifiers. Discussed confidentiality: Yes   I discussed the limitations of telemedicine and the availability of in person appointments.  Discussed there is a possibility of technology failure and discussed alternative modes of communication if that failure occurs.  I discussed that engaging in this telemedicine visit, they consent to the provision of behavioral healthcare and the services will be billed under their insurance.  Patient and/or legal guardian expressed understanding and consented to Telemedicine visit: Yes   Presenting Concerns: Patient and/or family reports the following symptoms/concerns: Pt and her mother concerned about needing pt back on her BH medication prior to mother's return to work; pt is feeling a little more confident caring for baby when he cries; no longer sad about FOB being out of the picture. Pt looking forward to first holiday season with baby, with no other concerns at this time; feels well supported at home with mom.  Duration of problem: Ongoing issues, with Postpartum adjustment; Severity of problem:  moderately severe  Patient and/or Family's  Strengths/Protective Factors: Social connections, Concrete supports in place (healthy food, safe environments, etc.), and Sense of purpose  Goals Addressed: Patient will:  Reduce symptoms of: anxiety and mood instability   Increase knowledge and/or ability of: stress reduction   Demonstrate ability to: Increase healthy adjustment to current life circumstances, Increase adequate support systems for patient/family, and Increase motivation to adhere to plan of care  Progress towards Goals: Ongoing  Interventions: Interventions utilized:  Link to [employer] and Supportive Reflection Standardized Assessments completed: Not Needed  Patient and/or Family Response: Patient agrees with treatment plan.   Assessment: Patient currently experiencing Anxiety disorder, unspecified; ADHD, MDD, Bipolar 1 disorder, ODD and Mild intellectual disability, all as previously diagnosed via psychiatry.   Patient may benefit from continued therapeutic interventions.  Plan: Follow up with behavioral health clinician on : Two weeks Behavioral recommendations:  -Continue taking iron pills as prescribed  -Continue accept referral for psychiatry; use [facility] walk-in to see psychiatry for [facility] medication management (this week; hours provided over the phone) -Continue using relaxation breathing exercise when feeling anxious throughout the day -Continue plan to attend [facility] in the future (ask if onsite childcare will accept your childcare voucher) -Continue prioritizing healthy self-care (regular meals, adequate rest; allowing practical help from supportive friends and family) until at least postpartum medical appointment -Consider new mom support group as needed at either www.postpartum.net or www.conehealthybaby.com   Referral(s): Integrated Art gallery manager (In Clinic), Community Mental Health Services (LME/Outside Clinic), and Community Resources:  new mom support  I  discussed the assessment and treatment plan with the patient and/or parent/guardian. They were provided an opportunity to ask questions and all were answered. They agreed with the plan and demonstrated an understanding of the instructions.   They were  advised to call back or seek an in-person evaluation if the symptoms worsen or if the condition fails to improve as anticipated.  Rae Lips, LCSW     03/29/2022   10:53 AM 02/13/2022   11:36 AM 11/09/2021    9:53 AM 08/10/2021   10:53 AM  Depression screen PHQ 2/9  Decreased Interest 3 0 0 0  Down, Depressed, Hopeless 1 1 0 1  PHQ - 2 Score 4 1 0 1  Altered sleeping 1 0 0 0  Tired, decreased energy 3 0 1 1  Change in appetite 0 0 1 0  Feeling bad or failure about yourself  0 0 0 0  Trouble concentrating 1 0 0 0  Moving slowly or fidgety/restless 0 0 0 0  Suicidal thoughts 0 0 0 0  PHQ-9 Score 9 1 2 2       03/29/2022   10:55 AM 02/13/2022   11:37 AM 11/09/2021    9:53 AM 08/10/2021   10:53 AM  GAD 7 : Generalized Anxiety Score  Nervous, Anxious, on Edge 0 0 0 0  Control/stop worrying 1 0 1 0  Worry too much - different things 3 0 1 0  Trouble relaxing 1 0 0 0  Restless 1 0 0 0  Easily annoyed or irritable 3 0 1 0  Afraid - awful might happen 1 0 0 0  Total GAD 7 Score 10 0 3 0

## 2022-04-08 ENCOUNTER — Other Ambulatory Visit: Payer: Self-pay

## 2022-04-08 ENCOUNTER — Emergency Department (HOSPITAL_COMMUNITY)
Admission: EM | Admit: 2022-04-08 | Discharge: 2022-04-08 | Disposition: A | Discharge status: Home / Self Care | Payer: Medicaid Other | Attending: Emergency Medicine | Admitting: Emergency Medicine

## 2022-04-08 DIAGNOSIS — K0889 Other specified disorders of teeth and supporting structures: Secondary | ICD-10-CM | POA: Insufficient documentation

## 2022-04-08 MED ORDER — ACETAMINOPHEN 325 MG PO TABS
650.0000 mg | ORAL_TABLET | Freq: Once | ORAL | Status: AC
  Administered 2022-04-08: 650 mg via ORAL
  Filled 2022-04-08: qty 2

## 2022-04-08 MED ORDER — PENICILLIN V POTASSIUM 250 MG PO TABS
500.0000 mg | ORAL_TABLET | Freq: Once | ORAL | Status: AC
  Administered 2022-04-08: 500 mg via ORAL
  Filled 2022-04-08: qty 2

## 2022-04-08 MED ORDER — PENICILLIN V POTASSIUM 500 MG PO TABS: 500.0000 mg | ORAL_TABLET | Freq: Three times a day (TID) | ORAL | 0 refills | Status: AC

## 2022-04-08 NOTE — ED Notes (Signed)
Pt also reporting left ear pain.

## 2022-04-08 NOTE — ED Provider Notes (Signed)
[name] EM[facility]GENCY DEPARTMENT Provider Note   CSN: [id number] Arrival date & time: 04/08/22  0204     History  Chief Complaint  Patient presents with   Dental Pain    Alexandria [name] is a 21 y.o. female.  The history is provided by the patient.  Patient presents with 2 complaints.  She reports diffuse dental pain that is worsening.  She recently missed her dental appointment after oversleeping.  She also reports right ear pain.  She put a Q-tip in there and she thinks that cotton swab got stuck. No other acute complaints     Home Medications Prior to Admission medications   Medication Sig Start Date End Date Taking? Authorizing Provider  penicillin v potassium (VEETID) 500 MG tablet Take 1 tablet (500 mg total) by mouth 3 (three) times daily for 7 days. 04/08/22 04/15/22 Yes Zadie Rhine, MD  albuterol (PROVENTIL HFA;VENTOLIN HFA) 108 (90 BASE) MCG/ACT inhaler Inhale 2 puffs into the lungs every 6 (six) hours as needed for wheezing or shortness of breath. Patient not taking: Reported on 02/16/2022    [provider]  cetirizine (ZYRTEC) 10 MG tablet Take 10 mg by mouth daily. Patient not taking: Reported on 03/21/2022    [provider]  etonogestrel (NEXPLANON) 68 MG IMPL implant 1 each by Subdermal route once.    [provider]  ferrous sulfate 325 (65 FE) MG tablet Take 1 tablet (325 mg total) by mouth every other day. 11/10/21   Cam Hai D, CNM  ipratropium (ATROVENT) 0.06 % nasal spray Place 1 spray into both nostrils 4 (four) times daily. Patient not taking: Reported on 02/16/2022 07/07/14   Linna Hoff, MD  Prenatal Vit-Fe Fumarate-FA (PRENATAL VITAMIN PO) Take by mouth. Patient not taking: Reported on 03/21/2022    [provider]      Allergies    Lactose intolerance (gi)    Review of Systems   Review of Systems  Physical Exam Updated Vital Signs BP (!) 137/98   Pulse 71   Temp 98.2 F (36.8 C) (Oral)    Resp 18   Ht 1.6 m (5\' 3" )   Wt 62.1 kg   SpO2 100%   BMI 24.27 kg/m  Physical Exam CONSTITUTIONAL: Well developed/well nourished HEAD AND FACE: Normocephalic/atraumatic EYES: EOMI/PERRL ENMT: Mucous membranes moist.  Poor dentition.  No trismus.  No focal abscess noted. Right TM is clear and intact, there is no foreign bodies noted.  Left TM with small amount of cerumen, no foreign bodies NECK: supple no meningeal signs NEURO: Pt is awake/alert, moves all extremitiesx4 EXTREMITIES:full ROM SKIN: warm, color normal  ED Results / Procedures / Treatments   Labs (all labs ordered are listed, but only abnormal results are displayed) Labs Reviewed - No data to display  EKG None  Radiology No results found.  Procedures Procedures    Medications Ordered in ED Medications  acetaminophen (TYLENOL) tablet 650 mg (650 mg Oral Given 04/08/22 0416)  penicillin v potassium (VEETID) tablet 500 mg (500 mg Oral Given 04/08/22 0416)    ED Course/ Medical Decision Making/ A&P                           Medical Decision Making Risk OTC drugs. Prescription drug management.   No foreign body noted in either ear.  Will start on antibiotics and follow-up with dentistry        Final Clinical Impression(s) / ED Diagnoses  Final diagnoses:  Pain, dental    Rx / DC Orders ED Discharge Orders          Ordered    penicillin v potassium (VEETID) 500 MG tablet  3 times daily        04/08/22 0400              Zadie Rhine, MD 04/08/22 [phone]

## 2022-04-08 NOTE — ED Triage Notes (Signed)
Pt arrived via POV c/o left upper dental pain. Pt reports she missed her recent dental appointment. Pt reports OTC medication is not successful in relieving pain.

## 2022-04-12 ENCOUNTER — Ambulatory Visit (INDEPENDENT_AMBULATORY_CARE_PROVIDER_SITE_OTHER): Payer: Medicaid Other | Admitting: Clinical

## 2022-04-12 DIAGNOSIS — F902 Attention-deficit hyperactivity disorder, combined type: Secondary | ICD-10-CM

## 2022-04-12 DIAGNOSIS — F419 Anxiety disorder, unspecified: Secondary | ICD-10-CM | POA: Diagnosis not present

## 2022-04-12 DIAGNOSIS — F70 Mild intellectual disabilities: Secondary | ICD-10-CM

## 2022-04-12 DIAGNOSIS — F319 Bipolar disorder, unspecified: Secondary | ICD-10-CM

## 2022-04-13 NOTE — BH Specialist Note (Signed)
Pt did not arrive to video visit and did not answer the phone; Unable to leave voicemail as "call cannot be completed at this time" after two attempts; Unable to leave MyChart message as not set up.

## 2022-04-14 ENCOUNTER — Telehealth: Payer: Self-pay | Admitting: Clinical

## 2022-04-14 NOTE — Telephone Encounter (Signed)
Returned call sent by pt's mother April, requesting information about [facility]; left message giving address and phone number about [facility], available 24/7.

## 2022-04-18 ENCOUNTER — Other Ambulatory Visit (HOSPITAL_COMMUNITY)
Admission: RE | Admit: 2022-04-18 | Discharge: 2022-04-18 | Disposition: A | Discharge status: Home / Self Care | Payer: Medicaid Other | Source: Ambulatory Visit | Attending: Women's Health | Admitting: Women's Health

## 2022-04-18 ENCOUNTER — Encounter: Payer: Self-pay | Admitting: Women's Health

## 2022-04-18 ENCOUNTER — Ambulatory Visit (INDEPENDENT_AMBULATORY_CARE_PROVIDER_SITE_OTHER): Payer: Medicaid Other | Admitting: Women's Health

## 2022-04-18 VITALS — BP 93/63 | Ht 65.0 in | Wt 131.4 lb

## 2022-04-18 DIAGNOSIS — N898 Other specified noninflammatory disorders of vagina: Secondary | ICD-10-CM | POA: Diagnosis present

## 2022-04-18 DIAGNOSIS — F418 Other specified anxiety disorders: Secondary | ICD-10-CM

## 2022-04-18 NOTE — Progress Notes (Signed)
GYN VISIT Patient name: Alexandria [name] MRN [id number]  Date of birth: 01-31-2001 Chief Complaint:   Follow-up (Depression )  History of Present Illness:   Alexandria [name] is a 21 y.o. G61P1001 African-American female 8wks s/p SVB being seen today for f/u on dep/anx. Has had 2 appointments w/ Jamie/IBH, was instructed on 12/13 to go to [facility] as a walk-in to see psychiatry for med management (was on Concerta and Clonodine prior to pregnancy, but has aged out of provider that rx'd in past). Mom who is present w/ pt today, states aunt is going to take her this week to do this. Pt denies SI/HI/II. Has good support, mom is staying home w/ her until she gets back on meds, and then plans to return to work. Has next appt w/ Jamie on 12/27.  Has noticed some vaginal itching. Hasn't had sex since baby. Some brown spotting.  No LMP recorded. The current method of family planning is Nexplanon.  Last pap <21yo. Results were: N/A     03/29/2022   10:53 AM 02/13/2022   11:36 AM 11/09/2021    9:53 AM 08/10/2021   10:53 AM  Depression screen PHQ 2/9  Decreased Interest 3 0 0 0  Down, Depressed, Hopeless 1 1 0 1  PHQ - 2 Score 4 1 0 1  Altered sleeping 1 0 0 0  Tired, decreased energy 3 0 1 1  Change in appetite 0 0 1 0  Feeling bad or failure about yourself  0 0 0 0  Trouble concentrating 1 0 0 0  Moving slowly or fidgety/restless 0 0 0 0  Suicidal thoughts 0 0 0 0  PHQ-9 Score 9 1 2 2         03/29/2022   10:55 AM 02/13/2022   11:37 AM 11/09/2021    9:53 AM 08/10/2021   10:53 AM  GAD 7 : Generalized Anxiety Score  Nervous, Anxious, on Edge 0 0 0 0  Control/stop worrying 1 0 1 0  Worry too much - different things 3 0 1 0  Trouble relaxing 1 0 0 0  Restless 1 0 0 0  Easily annoyed or irritable 3 0 1 0  Afraid - awful might happen 1 0 0 0  Total GAD 7 Score 10 0 3 0     Review of Systems:   Pertinent items are noted in HPI Denies fever/chills, dizziness, headaches, visual  disturbances, fatigue, shortness of breath, chest pain, abdominal pain, vomiting, abnormal vaginal discharge/itching/odor/irritation, problems with periods, bowel movements, urination, or intercourse unless otherwise stated above.  Pertinent History Reviewed:  Reviewed past medical,surgical, social, obstetrical and family history.  Reviewed problem list, medications and allergies. Physical Assessment:   Vitals:   04/18/22 1137  BP: 93/63  Weight: 131 lb 6 oz (59.6 kg)  Height: 5\' 5"  (1.651 m)  Body mass index is 21.86 kg/m.       Physical Examination:   General appearance: alert, well appearing, and in no distress  Mental status: alert, oriented to person, place, and time  Skin: warm & dry   Cardiovascular: normal heart rate noted  Respiratory: normal respiratory effort, no distress  Abdomen: soft, non-tender   Pelvic: blind CV swab, +odor  Extremities: no edema   Chaperone: 04/20/22    No results found for this or any previous visit (from the past 24 hour(s)).  Assessment & Plan:  1) Dep/anx> has had 2 appts w/ Jamie/IBH, aunt is taking her to [facility] as  a walk in to see psychiatry this week for med management (was on Concerta and Clonodine prior to pregnancy- aged out of provider who rx'd this in past). No current SI/HI/II. Has good support. Next appt w/ Jamie on 12/27. Will not schedule any further f/u here for this, unless pt requests.   2) Vulvar itching> CV swab sent  Meds: No orders of the defined types were placed in this encounter.   No orders of the defined types were placed in this encounter.   Return for prn; 21yo for pap.  Cheral Marker CNM, [facility] 04/18/2022 12:40 PM

## 2022-04-19 LAB — CERVICOVAGINAL ANCILLARY ONLY
Bacterial Vaginitis (gardnerella): POSITIVE — AB
Candida Glabrata: NEGATIVE
Candida Vaginitis: NEGATIVE
Chlamydia: NEGATIVE
Comment: NEGATIVE
Comment: NEGATIVE
Comment: NEGATIVE
Comment: NEGATIVE
Comment: NEGATIVE
Comment: NORMAL
Neisseria Gonorrhea: NEGATIVE
Trichomonas: NEGATIVE

## 2022-04-25 MED ORDER — METRONIDAZOLE 500 MG PO TABS: 500.0000 mg | ORAL_TABLET | Freq: Two times a day (BID) | ORAL | 0 refills | Status: DC

## 2022-04-25 NOTE — Addendum Note (Signed)
Addended by: Shawna Clamp R on: 04/25/2022 11:25 PM   Modules accepted: Orders

## 2022-04-26 ENCOUNTER — Ambulatory Visit: Payer: Medicaid Other | Admitting: Clinical

## 2022-04-26 DIAGNOSIS — Z91199 Patient's noncompliance with other medical treatment and regimen due to unspecified reason: Secondary | ICD-10-CM

## 2022-05-15 ENCOUNTER — Ambulatory Visit (HOSPITAL_COMMUNITY): Payer: Medicaid Other | Admitting: Psychiatry

## 2022-06-06 ENCOUNTER — Encounter (HOSPITAL_COMMUNITY): Payer: Self-pay

## 2022-06-06 ENCOUNTER — Ambulatory Visit (HOSPITAL_COMMUNITY): Payer: No Typology Code available for payment source | Admitting: Psychiatry

## 2022-06-26 ENCOUNTER — Ambulatory Visit (HOSPITAL_COMMUNITY): Payer: No Typology Code available for payment source | Admitting: Psychiatry

## 2022-06-26 ENCOUNTER — Encounter (HOSPITAL_COMMUNITY): Payer: Self-pay

## 2022-07-04 ENCOUNTER — Ambulatory Visit (INDEPENDENT_AMBULATORY_CARE_PROVIDER_SITE_OTHER): Payer: No Typology Code available for payment source | Admitting: Psychiatry

## 2022-07-04 ENCOUNTER — Encounter (HOSPITAL_COMMUNITY): Payer: Self-pay | Admitting: Psychiatry

## 2022-07-04 DIAGNOSIS — F4310 Post-traumatic stress disorder, unspecified: Secondary | ICD-10-CM | POA: Diagnosis not present

## 2022-07-04 DIAGNOSIS — F70 Mild intellectual disabilities: Secondary | ICD-10-CM

## 2022-07-04 DIAGNOSIS — G4710 Hypersomnia, unspecified: Secondary | ICD-10-CM

## 2022-07-04 DIAGNOSIS — F902 Attention-deficit hyperactivity disorder, combined type: Secondary | ICD-10-CM

## 2022-07-04 DIAGNOSIS — F411 Generalized anxiety disorder: Secondary | ICD-10-CM

## 2022-07-04 DIAGNOSIS — F332 Major depressive disorder, recurrent severe without psychotic features: Secondary | ICD-10-CM

## 2022-07-04 DIAGNOSIS — Z758 Other problems related to medical facilities and other health care: Secondary | ICD-10-CM

## 2022-07-04 MED ORDER — SERTRALINE HCL 25 MG PO TABS: 25.0000 mg | ORAL_TABLET | Freq: Every day | ORAL | 0 refills | Status: AC

## 2022-07-04 MED ORDER — CLONIDINE HCL 0.1 MG PO TABS: 0.1000 mg | ORAL_TABLET | Freq: Every evening | ORAL | 0 refills | Status: DC

## 2022-07-04 NOTE — Patient Instructions (Signed)
We restarted the clonidine 0.1 mg nightly today.  We also started a new medication sertraline (Zoloft) at 25 mg once daily today.  I know that you have been having thoughts of death and self-harm, if these thoughts worsen or you are feeling unsafe at home you can call 1 of these numbers 988, [facility] at [phone] for mental health emergencies.  You have to let our office know.  Do try to establish care with a primary care writer there is a clinic that operates out of the same building on [address] as our clinic.  If you do I would recommend getting a vitamin D, B12, TSH, iron panel to start out with.  Also please try to come by the clinic with a copy of the ADHD testing that you had completed when you were child.
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
## 2022-07-13 IMAGING — US US OB COMP LESS 14 WK
1 series · 14 of 28 positions shown · non-contrast
Comparison: None.

CLINICAL DATA: Abdominal pain

EXAM:
OBSTETRIC <14 WK US AND TRANSVAGINAL OB US
TECHNIQUE: Both transabdominal and transvaginal ultrasound examinations were
performed for complete evaluation of the gestation as well as the
maternal uterus, adnexal regions, and pelvic cul-de-sac.
Transvaginal technique was performed to assess early pregnancy.

[Series 1: us ob less than 14 weeks with ob transvaginal · 61 acquisitions, 14 frames shown]
[im 3/61]
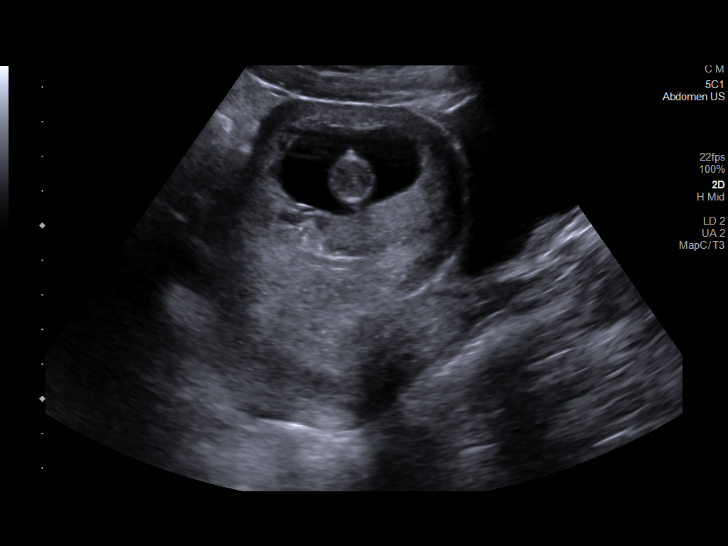
[im 7/61]
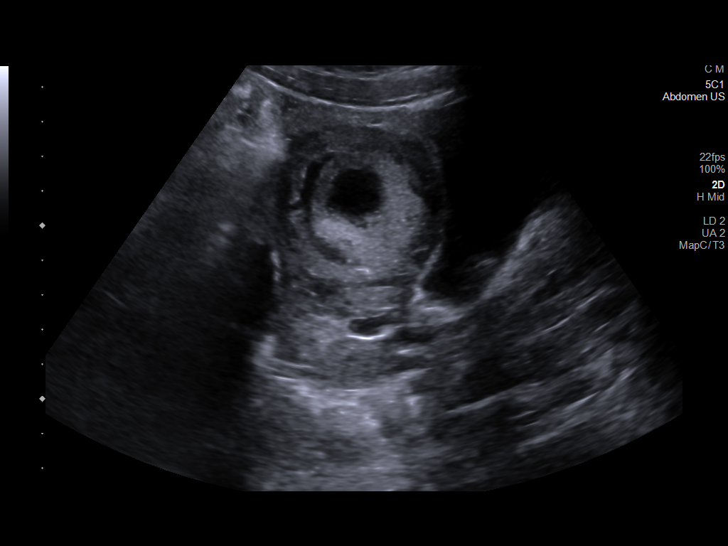
[im 12/61]
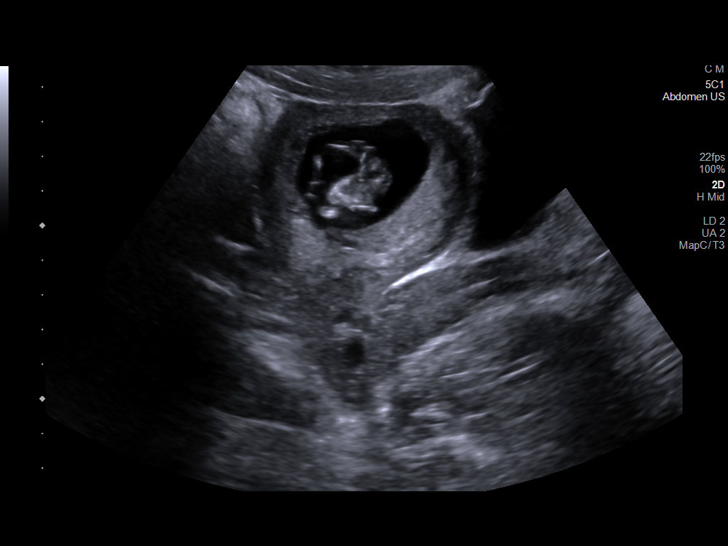
[im 16/61]
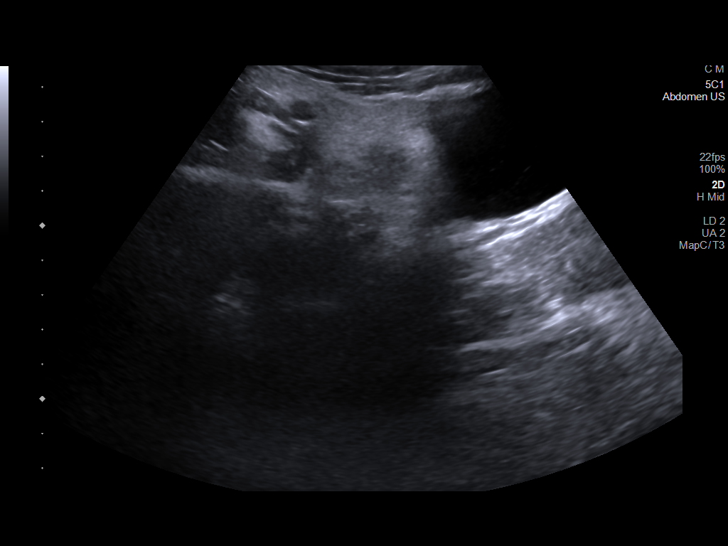
[im 21/61]
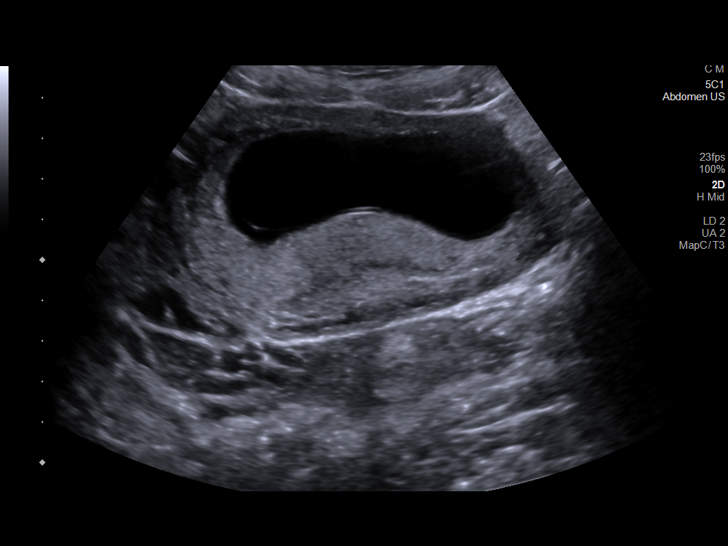
[im 25/61]
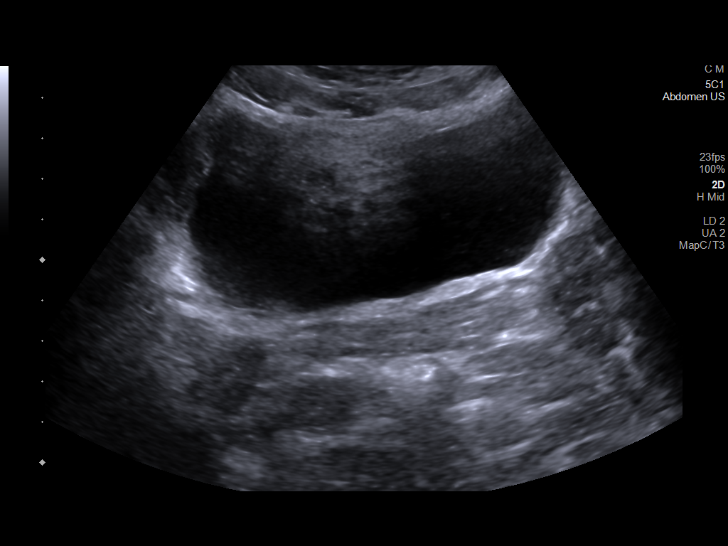
[im 29/61]
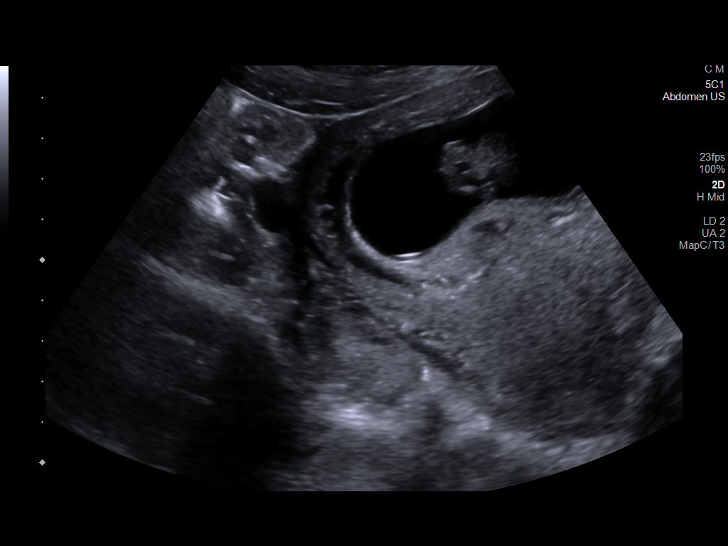
[im 34/61]
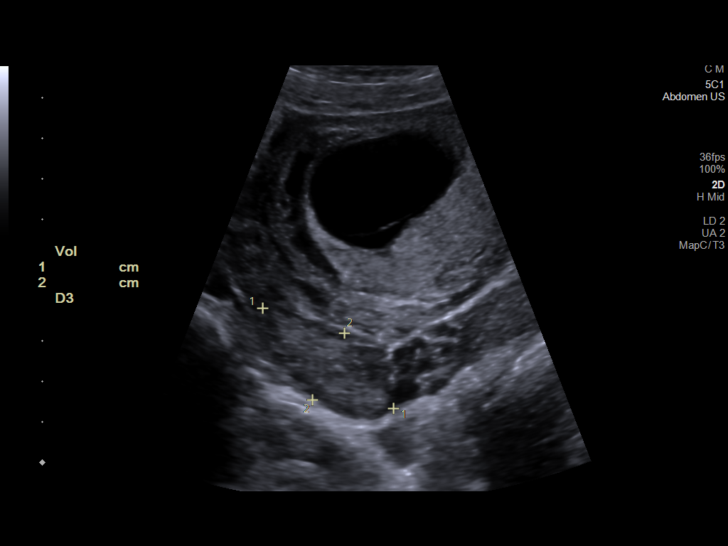
[im 38/61]
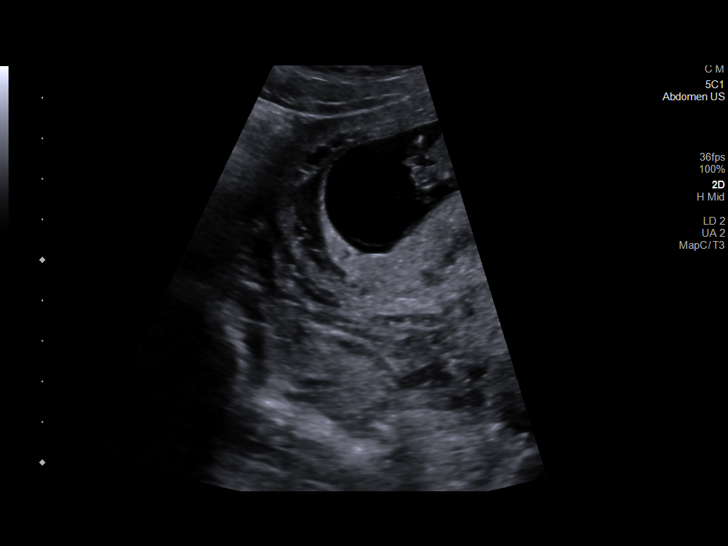
[im 43/61]
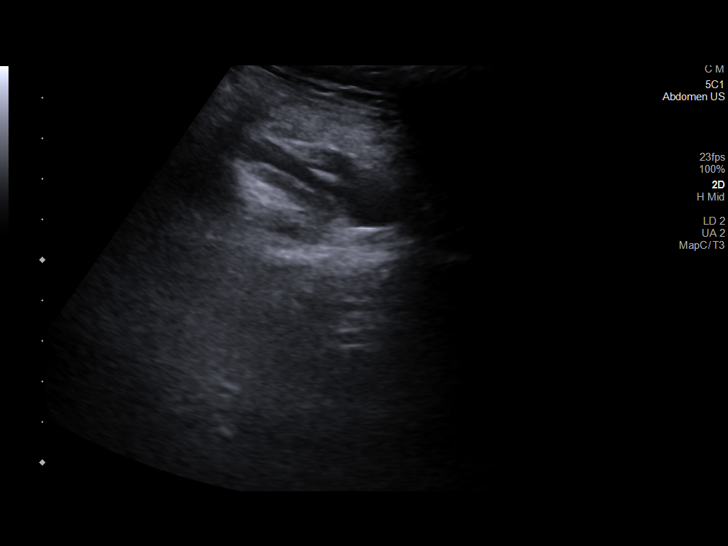
[im 47/61]
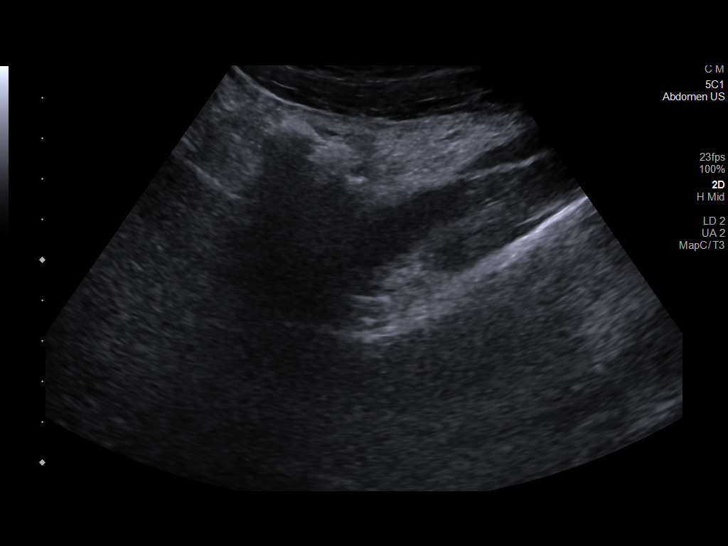
[im 52/61]
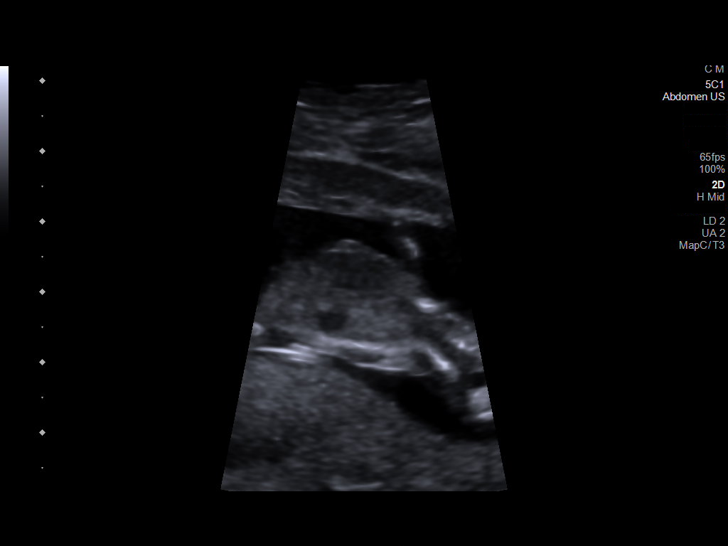
[im 56/61]
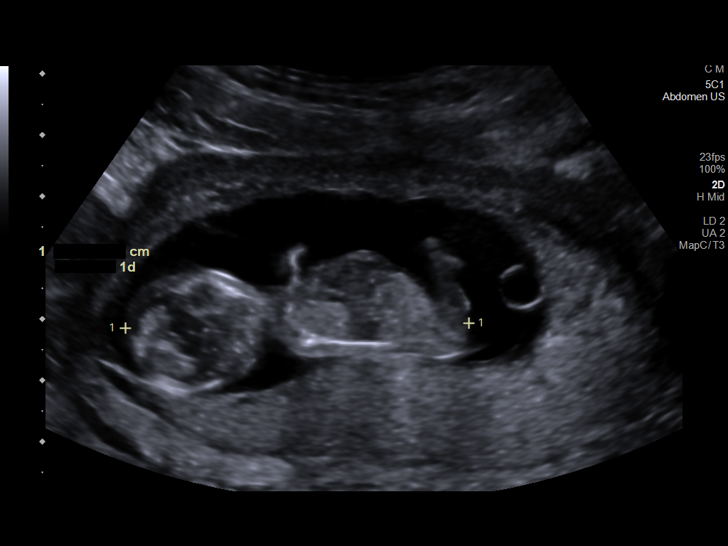
[im 61/61]
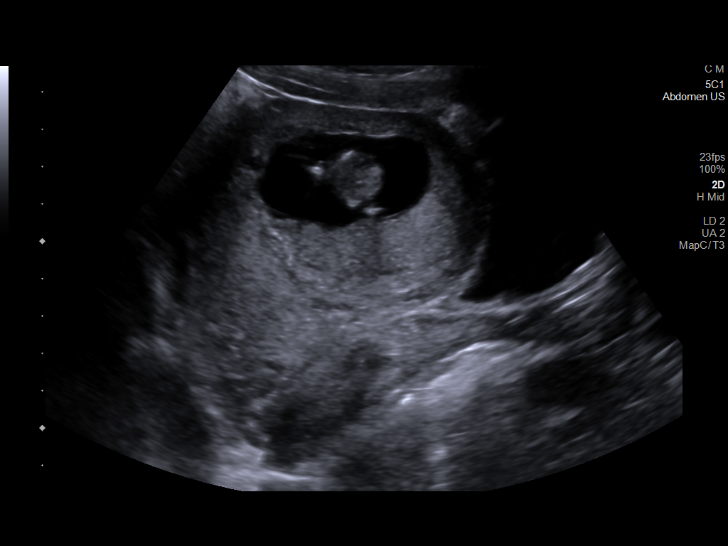

[14 of 28 positions shown; findings below may reference images not displayed]

FINDINGS: Intrauterine gestational sac: Single

Yolk sac:  Not Visualized.

Embryo:  Visualized.

Cardiac Activity: Visualized.

Heart Rate: 167 bpm

CRL:  56 mm   12 w   0 d                  US EDC: 02/11/2022

Subchorionic hemorrhage:  None visualized.

Maternal uterus/adnexae: Right ovary is normal. Left ovary not
visualized.
IMPRESSION: Single live intrauterine gestation with approximate gestational age
of 12 weeks and 0 days. EDC based on today's sonogram is 02/11/2022.

## 2023-01-23 ENCOUNTER — Ambulatory Visit: Payer: MEDICAID | Admitting: Adult Health

## 2023-01-24 ENCOUNTER — Ambulatory Visit: Payer: MEDICAID | Admitting: Adult Health

## 2023-08-07 ENCOUNTER — Other Ambulatory Visit: Payer: Self-pay

## 2023-08-07 ENCOUNTER — Emergency Department (HOSPITAL_COMMUNITY)
Admission: EM | Admit: 2023-08-07 | Discharge: 2023-08-08 | Discharge status: Against Medical Advice | Payer: MEDICAID | Attending: Emergency Medicine | Admitting: Emergency Medicine

## 2023-08-07 ENCOUNTER — Encounter (HOSPITAL_COMMUNITY): Payer: Self-pay | Admitting: *Deleted

## 2023-08-07 DIAGNOSIS — M546 Pain in thoracic spine: Secondary | ICD-10-CM | POA: Diagnosis present

## 2023-08-07 DIAGNOSIS — Z5321 Procedure and treatment not carried out due to patient leaving prior to being seen by health care provider: Secondary | ICD-10-CM | POA: Insufficient documentation

## 2023-08-07 DIAGNOSIS — Y92481 Parking lot as the place of occurrence of the external cause: Secondary | ICD-10-CM | POA: Diagnosis not present
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
## 2023-08-08 NOTE — ED Notes (Signed)
 Called multiple times for VS and registration, no response

## 2023-08-11 ENCOUNTER — Other Ambulatory Visit: Payer: Self-pay

## 2023-08-11 ENCOUNTER — Emergency Department (HOSPITAL_COMMUNITY)
Admission: EM | Admit: 2023-08-11 | Discharge: 2023-08-12 | Disposition: A | Discharge status: Home / Self Care | Payer: MEDICAID | Attending: Emergency Medicine | Admitting: Emergency Medicine

## 2023-08-11 ENCOUNTER — Encounter (HOSPITAL_COMMUNITY): Payer: Self-pay | Admitting: *Deleted

## 2023-08-11 DIAGNOSIS — F515 Nightmare disorder: Secondary | ICD-10-CM | POA: Insufficient documentation

## 2023-08-11 DIAGNOSIS — M545 Low back pain, unspecified: Secondary | ICD-10-CM | POA: Insufficient documentation
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
## 2023-08-11 NOTE — ED Triage Notes (Signed)
 The pt reports that she can  come back tomorrow if the yeast cannot be seen tonight

## 2023-08-12 ENCOUNTER — Emergency Department (HOSPITAL_COMMUNITY): Payer: MEDICAID

## 2023-08-12 LAB — URINALYSIS, ROUTINE W REFLEX MICROSCOPIC
Bilirubin Urine: NEGATIVE
Glucose, UA: NEGATIVE mg/dL
Hgb urine dipstick: NEGATIVE
Ketones, ur: 20 mg/dL — AB
Nitrite: NEGATIVE
Protein, ur: NEGATIVE mg/dL
Specific Gravity, Urine: 1.031 — ABNORMAL HIGH (ref 1.005–1.030)
pH: 5 (ref 5.0–8.0)

## 2023-08-12 LAB — PREGNANCY, URINE: Preg Test, Ur: NEGATIVE

## 2023-08-12 MED ORDER — KETOROLAC TROMETHAMINE 30 MG/ML IJ SOLN
30.0000 mg | Freq: Once | INTRAMUSCULAR | Status: AC
  Administered 2023-08-12: 30 mg via INTRAMUSCULAR

## 2023-08-12 MED ORDER — KETOROLAC TROMETHAMINE 30 MG/ML IJ SOLN
15.0000 mg | Freq: Once | INTRAMUSCULAR | Status: DC
  Filled 2023-08-12: qty 1

## 2023-08-12 MED ORDER — LIDOCAINE 5 % EX PTCH
1.0000 | MEDICATED_PATCH | CUTANEOUS | Status: DC
  Administered 2023-08-12: 1 via TRANSDERMAL
  Filled 2023-08-12: qty 1

## 2023-08-12 NOTE — Discharge Instructions (Addendum)
 You can try unisom and melatonin to help with sleep. These are not addictive but you can become dependent on them for sleep so if you aren't having improvement in your sleep in about a week or two start working with your primary doctor to try and get counseling/therapy to help with possible PTSD.

## 2023-08-13 ENCOUNTER — Encounter (HOSPITAL_COMMUNITY): Payer: Self-pay | Admitting: *Deleted

## 2023-08-13 ENCOUNTER — Ambulatory Visit (HOSPITAL_COMMUNITY)
Admission: EM | Admit: 2023-08-13 | Discharge: 2023-08-13 | Disposition: A | Discharge status: Home / Self Care | Payer: MEDICAID | Attending: Family Medicine | Admitting: Family Medicine

## 2023-08-13 ENCOUNTER — Other Ambulatory Visit: Payer: Self-pay

## 2023-08-13 DIAGNOSIS — N760 Acute vaginitis: Secondary | ICD-10-CM | POA: Diagnosis present

## 2023-08-13 DIAGNOSIS — J069 Acute upper respiratory infection, unspecified: Secondary | ICD-10-CM | POA: Insufficient documentation

## 2023-08-13 DIAGNOSIS — R070 Pain in throat: Secondary | ICD-10-CM | POA: Insufficient documentation

## 2023-08-13 LAB — POCT RAPID STREP A (OFFICE): Rapid Strep A Screen: NEGATIVE

## 2023-08-13 MED ORDER — BENZONATATE 100 MG PO CAPS: 100.0000 mg | ORAL_CAPSULE | Freq: Three times a day (TID) | ORAL | 0 refills | Status: DC | PRN

## 2023-08-13 MED ORDER — FLUCONAZOLE 150 MG PO TABS: 150.0000 mg | ORAL_TABLET | ORAL | 0 refills | Status: AC

## 2023-08-13 NOTE — ED Triage Notes (Signed)
 Pt reports she may have a yeast infection.
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
## 2023-08-13 NOTE — Discharge Instructions (Addendum)
 Your strep test is negative.  Culture of the throat will be sent, and staff will notify you if that is in turn positive.  Staff will notify you if there is anything positive on the vaginal swab.  Take fluconazole 150 mg--1 tablet every 3 days for 2 doses  Take benzonatate 100 mg, 1 tab every 8 hours as needed for cough.

## 2023-08-15 ENCOUNTER — Telehealth (HOSPITAL_COMMUNITY): Payer: Self-pay

## 2023-08-15 LAB — CERVICOVAGINAL ANCILLARY ONLY
Bacterial Vaginitis (gardnerella): POSITIVE — AB
Candida Glabrata: NEGATIVE
Candida Vaginitis: POSITIVE — AB
Chlamydia: POSITIVE — AB
Comment: NEGATIVE
Comment: NEGATIVE
Comment: NEGATIVE
Comment: NEGATIVE
Comment: NEGATIVE
Comment: NORMAL
Neisseria Gonorrhea: POSITIVE — AB
Trichomonas: NEGATIVE

## 2023-08-15 MED ORDER — DOXYCYCLINE HYCLATE 100 MG PO TABS: 100.0000 mg | ORAL_TABLET | Freq: Two times a day (BID) | ORAL | 0 refills | Status: AC

## 2023-08-15 NOTE — Telephone Encounter (Signed)
 Per protocol, pt will need to return to UC for Nurse Visit for Rocephin 500mg  IM.  Per protocol, pt requires tx with metronidazole and Doxycycline.  Reviewed with patient, verified pharmacy, prescription sent.

## 2023-08-16 LAB — CULTURE, GROUP A STREP (THRC)
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
## 2023-08-27 ENCOUNTER — Ambulatory Visit (HOSPITAL_COMMUNITY): Payer: MEDICAID

## 2023-08-30 ENCOUNTER — Ambulatory Visit (HOSPITAL_COMMUNITY): Payer: MEDICAID

## 2023-08-30 ENCOUNTER — Ambulatory Visit (HOSPITAL_COMMUNITY)
Admission: EM | Admit: 2023-08-30 | Discharge: 2023-08-30 | Disposition: A | Discharge status: Home / Self Care | Payer: MEDICAID | Attending: Family Medicine | Admitting: Family Medicine

## 2023-08-30 ENCOUNTER — Encounter (HOSPITAL_COMMUNITY): Payer: Self-pay | Admitting: Emergency Medicine

## 2023-08-30 DIAGNOSIS — A64 Unspecified sexually transmitted disease: Secondary | ICD-10-CM

## 2023-08-30 MED ORDER — LIDOCAINE HCL (PF) 1 % IJ SOLN
INTRAMUSCULAR | Status: AC
  Filled 2023-08-30: qty 2

## 2023-08-30 MED ORDER — DOXYCYCLINE HYCLATE 100 MG PO CAPS: 100.0000 mg | ORAL_CAPSULE | Freq: Two times a day (BID) | ORAL | 0 refills | Status: AC

## 2023-08-30 MED ORDER — CEFTRIAXONE SODIUM 500 MG IJ SOLR
INTRAMUSCULAR | Status: AC
  Filled 2023-08-30: qty 500

## 2023-08-30 MED ORDER — CEFTRIAXONE SODIUM 500 MG IJ SOLR
500.0000 mg | INTRAMUSCULAR | Status: DC
  Administered 2023-08-30: 500 mg via INTRAMUSCULAR

## 2023-08-30 MED ORDER — METRONIDAZOLE 500 MG PO TABS: 500.0000 mg | ORAL_TABLET | Freq: Two times a day (BID) | ORAL | 0 refills | Status: AC

## 2023-08-30 MED ORDER — FLUCONAZOLE 150 MG PO TABS: 150.0000 mg | ORAL_TABLET | ORAL | 0 refills | Status: AC

## 2023-08-30 NOTE — Discharge Instructions (Signed)
 You have been given a shot of ceftriaxone  500 mg  Take doxycycline  100 mg --1 capsule 2 times daily for 7 days  Take metronidazole  500 mg--1 tablet 2 times daily for 7 days.  Avoid drinking alcohol within 72 hours of taking this medication  You are also positive for yeast. Take fluconazole  150 mg--1 tablet every 3 days for 3 doses  You can take all these medicines together at the same time, following the instructions on the bottle.  Do not have intercourse for 1 week after you have finished all medications.

## 2023-08-30 NOTE — ED Provider Notes (Signed)
 [facility]    CSN: [id number] Arrival date & time: 08/30/23  1323      History   Chief Complaint Chief Complaint  Patient presents with   SEXUALLY TRANSMITTED DISEASE    HPI Alexandria [name] is a 23 y.o. female.   HPI Here for treatment for STDs. On April 14 I saw her here and tested her for sexually transmitted diseases.  She was positive for chlamydia, gonorrhea, yeast, and BV.  There is documentation in her lab notes, that she was contacted about these results and was informed that they were prescription sent to the pharmacy.  She did not go to the pharmacy to fill those.  No explanation offered.  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 Authorizing Provider  doxycycline  (VIBRAMYCIN ) 100 MG  capsule Take 1 capsule (100 mg total) by mouth 2 (two) times daily for 7 days. 08/30/23 09/06/23 Yes Ann Keto, MD  fluconazole  (DIFLUCAN ) 150 MG tablet Take 1 tablet (150 mg total) by mouth every 3 (three) days for 3 doses. 08/30/23 09/06/23 Yes Ann Keto, MD  metroNIDAZOLE  (FLAGYL ) 500 MG tablet Take 1 tablet (500 mg total) by mouth 2 (two) times daily for 7 days. 08/30/23 09/06/23 Yes Ann Keto, MD  etonogestrel  (NEXPLANON ) 68 MG IMPL implant 1 each by Subdermal route once.    [provider]  sertraline  (ZOLOFT ) 25 MG tablet Take 1 tablet (25 mg total) by mouth daily. 07/04/22 08/03/22  Madie Schilling, MD    Family History Family History  Problem Relation Age of Onset   ADD / ADHD Mother    Bipolar disorder Mother    Anxiety disorder Mother     Social History Social History   Tobacco Use   Smoking status: Never   Smokeless tobacco: Never  Vaping Use   Vaping status: Never Used  Substance Use Topics   Alcohol use: No   Drug use: Not Currently    Types: Marijuana    Comment:  smoked when 2 mths preg     Allergies   Lactose intolerance (gi)   Review of Systems Review of Systems   Physical Exam Triage Vital Signs ED Triage Vitals  Encounter Vitals Group     BP 08/30/23 1459 116/84     Systolic BP Percentile --      Diastolic BP Percentile --      Pulse Rate 08/30/23 1459 96     Resp 08/30/23 1459 14     Temp 08/30/23 1459 98.1 F (36.7 C)     Temp Source 08/30/23 1459 Oral     SpO2 08/30/23 1459 98 %     Weight --      Height --      Head Circumference --      Peak Flow --      Pain Score 08/30/23 1458 0     Pain Loc --      Pain Education --      Exclude from Growth Chart --    No data found.  Updated Vital Signs BP 116/84 (BP Location: Right Arm)   Pulse 96   Temp 98.1 F (36.7 C) (Oral)   Resp 14   LMP 08/23/2023 Comment: PT has an irregular period with implant  SpO2 98%   Visual Acuity Right Eye Distance:   Left Eye  Distance:   Bilateral Distance:    Right Eye Near:   Left Eye Near:    Bilateral Near:     Physical Exam Vitals reviewed.  Constitutional:      General: She is not in acute distress.    Appearance: She is not toxic-appearing.  Skin:    Coloration: Skin is not pale.  Neurological:     Mental Status: She is alert and oriented to person, place, and time.  Psychiatric:        Behavior: Behavior normal.      UC Treatments / Results  Labs (all labs ordered are listed, but only abnormal results are displayed) Labs Reviewed - No data to display  EKG   Radiology No results found.  Procedures Procedures (including critical care time)  Medications Ordered in UC Medications  cefTRIAXone  (ROCEPHIN ) injection 500 mg (has no administration in time range)    Initial Impression / Assessment and Plan / UC Course  I have reviewed the triage vital signs and the nursing notes.  Pertinent labs & imaging results that were available during my care of the patient were reviewed by me and considered in my medical decision making (see chart for details).     Rocephin  is given here.  Doxycycline , metronidazole , and Diflucan  are all sent in to treat the other positives on her swab.  When I ask her to notify her partner of the need to be tested and treated, she states she does not know who to notify.  She states she had intercourse about 1 month ago and is not sure who to notify.  I have discussed with her to please wear condom protection when she is having intercourse.  Her chart states she is still nursing.  On review of the lactmed, Diflucan , Flagyl , and doxycycline  are all safe in breast-feeding. Final Clinical Impressions(s) / UC Diagnoses   Final diagnoses:  STD (sexually transmitted disease)     Discharge Instructions      You have been given a shot of ceftriaxone  500 mg  Take doxycycline  100 mg --1 capsule 2 times daily for 7 days  Take metronidazole  500 mg--1  tablet 2  times daily for 7 days.  Avoid drinking alcohol within 72 hours of taking this medication  You are also positive for yeast. Take fluconazole  150 mg--1 tablet every 3 days for 3 doses  You can take all these medicines together at the same time, following the instructions on the bottle.  Do not have intercourse for 1 week after you have finished all medications.      ED Prescriptions     Medication Sig Dispense Auth. Provider   doxycycline  (VIBRAMYCIN ) 100 MG capsule Take 1 capsule (100 mg total) by mouth 2 (two) times daily for 7 days. 14 capsule Gen Clagg K, MD   metroNIDAZOLE  (FLAGYL ) 500 MG tablet Take 1 tablet (500 mg total) by mouth 2 (two) times daily for 7 days. 14 tablet Veida Spira K, MD   fluconazole  (DIFLUCAN ) 150 MG tablet Take 1 tablet (150 mg total) by mouth every 3 (three) days for 3 doses. 3 tablet Mercades Bajaj, Paige Boatman, MD      PDMP not reviewed this encounter.   Ann Keto, MD 08/30/23 [phone]

## 2023-08-30 NOTE — ED Triage Notes (Signed)
 Pt's friend on the phone reports patient was positive for BV, yeast and gonorrhea and needs treatment for her positive test results couple weeks ago.

## 2023-09-27 ENCOUNTER — Ambulatory Visit (HOSPITAL_COMMUNITY): Payer: MEDICAID
